# Patient Record
Sex: Male | Born: 1955 | Race: White | Hispanic: No | Marital: Married | Smoking: Former smoker
Health system: Southern US, Community
[De-identification: ages and names within clinical notes are randomized; demographics above are authoritative.]

## PROBLEM LIST (undated history)

## (undated) DIAGNOSIS — T8089XA Other complications following infusion, transfusion and therapeutic injection, initial encounter: Secondary | ICD-10-CM

## (undated) DIAGNOSIS — I10 Essential (primary) hypertension: Secondary | ICD-10-CM

## (undated) DIAGNOSIS — Z1211 Encounter for screening for malignant neoplasm of colon: Secondary | ICD-10-CM

## (undated) DIAGNOSIS — E119 Type 2 diabetes mellitus without complications: Secondary | ICD-10-CM

## (undated) DIAGNOSIS — M172 Bilateral post-traumatic osteoarthritis of knee: Secondary | ICD-10-CM

## (undated) DIAGNOSIS — Z0000 Encounter for general adult medical examination without abnormal findings: Secondary | ICD-10-CM

## (undated) DIAGNOSIS — Z125 Encounter for screening for malignant neoplasm of prostate: Secondary | ICD-10-CM

## (undated) DIAGNOSIS — M109 Gout, unspecified: Secondary | ICD-10-CM

## (undated) DIAGNOSIS — I2510 Atherosclerotic heart disease of native coronary artery without angina pectoris: Secondary | ICD-10-CM

## (undated) DIAGNOSIS — N200 Calculus of kidney: Secondary | ICD-10-CM

## (undated) DIAGNOSIS — E785 Hyperlipidemia, unspecified: Secondary | ICD-10-CM

## (undated) DIAGNOSIS — I5032 Chronic diastolic (congestive) heart failure: Secondary | ICD-10-CM

## (undated) DIAGNOSIS — K625 Hemorrhage of anus and rectum: Secondary | ICD-10-CM

## (undated) DIAGNOSIS — R0781 Pleurodynia: Secondary | ICD-10-CM

## (undated) HISTORY — DX: Type 2 diabetes mellitus without complications: E11.9

## (undated) HISTORY — DX: Calculus of kidney: N20.0

## (undated) HISTORY — DX: Hyperlipidemia, unspecified: E78.5

## (undated) HISTORY — DX: Pleurodynia: R07.81

## (undated) HISTORY — DX: Encounter for screening for malignant neoplasm of prostate: Z12.5

## (undated) HISTORY — DX: Chronic diastolic (congestive) heart failure: I50.32

## (undated) HISTORY — PX: CORONARY ANGIOPLASTY WITH STENT PLACEMENT: SHX49

## (undated) HISTORY — PX: TRIGGER FINGER RELEASE: SHX641

## (undated) HISTORY — DX: Atherosclerotic heart disease of native coronary artery without angina pectoris: I25.10

## (undated) HISTORY — PX: CARPAL TUNNEL RELEASE: SHX101

## (undated) HISTORY — DX: Other complications following infusion, transfusion and therapeutic injection, initial encounter: T80.89XA

## (undated) HISTORY — DX: Encounter for general adult medical examination without abnormal findings: Z00.00

## (undated) HISTORY — PX: KNEE SURGERY: SHX244

## (undated) HISTORY — DX: Encounter for screening for malignant neoplasm of colon: Z12.11

## (undated) HISTORY — DX: Hemorrhage of anus and rectum: K62.5

## (undated) HISTORY — DX: Essential (primary) hypertension: I10

## (undated) HISTORY — DX: Bilateral post-traumatic osteoarthritis of knee: M17.2

## (undated) HISTORY — DX: Gout, unspecified: M10.9

---

## 2014-02-09 ENCOUNTER — Other Ambulatory Visit: Payer: Self-pay | Admitting: Orthopedic Surgery

## 2014-02-09 DIAGNOSIS — M546 Pain in thoracic spine: Secondary | ICD-10-CM

## 2014-02-11 ENCOUNTER — Other Ambulatory Visit: Payer: Self-pay | Admitting: Orthopedic Surgery

## 2014-02-11 DIAGNOSIS — M546 Pain in thoracic spine: Secondary | ICD-10-CM

## 2014-02-25 ENCOUNTER — Ambulatory Visit
Admission: RE | Admit: 2014-02-25 | Discharge: 2014-02-25 | Disposition: A | Discharge status: Home / Self Care | Payer: 59 | Source: Ambulatory Visit | Attending: Orthopedic Surgery | Admitting: Orthopedic Surgery

## 2014-02-25 ENCOUNTER — Other Ambulatory Visit: Payer: Self-pay

## 2014-02-25 DIAGNOSIS — M546 Pain in thoracic spine: Secondary | ICD-10-CM

## 2014-07-07 DIAGNOSIS — I5032 Chronic diastolic (congestive) heart failure: Secondary | ICD-10-CM

## 2014-07-07 DIAGNOSIS — I2510 Atherosclerotic heart disease of native coronary artery without angina pectoris: Secondary | ICD-10-CM

## 2014-07-07 DIAGNOSIS — E785 Hyperlipidemia, unspecified: Secondary | ICD-10-CM

## 2014-07-07 DIAGNOSIS — I10 Essential (primary) hypertension: Secondary | ICD-10-CM

## 2014-07-07 HISTORY — DX: Atherosclerotic heart disease of native coronary artery without angina pectoris: I25.10

## 2014-07-07 HISTORY — DX: Hyperlipidemia, unspecified: E78.5

## 2014-07-07 HISTORY — DX: Chronic diastolic (congestive) heart failure: I50.32

## 2014-07-07 HISTORY — DX: Essential (primary) hypertension: I10

## 2015-02-10 DIAGNOSIS — N200 Calculus of kidney: Secondary | ICD-10-CM

## 2015-02-10 DIAGNOSIS — M109 Gout, unspecified: Secondary | ICD-10-CM

## 2015-02-10 DIAGNOSIS — I10 Essential (primary) hypertension: Secondary | ICD-10-CM

## 2015-02-10 DIAGNOSIS — E119 Type 2 diabetes mellitus without complications: Secondary | ICD-10-CM

## 2015-02-10 DIAGNOSIS — I2510 Atherosclerotic heart disease of native coronary artery without angina pectoris: Secondary | ICD-10-CM

## 2015-02-10 HISTORY — DX: Essential (primary) hypertension: I10

## 2015-02-10 HISTORY — DX: Atherosclerotic heart disease of native coronary artery without angina pectoris: I25.10

## 2015-02-10 HISTORY — DX: Calculus of kidney: N20.0

## 2015-02-10 HISTORY — DX: Type 2 diabetes mellitus without complications: E11.9

## 2015-02-10 HISTORY — DX: Gout, unspecified: M10.9

## 2015-02-28 DIAGNOSIS — K625 Hemorrhage of anus and rectum: Secondary | ICD-10-CM | POA: Insufficient documentation

## 2015-02-28 HISTORY — DX: Hemorrhage of anus and rectum: K62.5

## 2016-03-22 DIAGNOSIS — M172 Bilateral post-traumatic osteoarthritis of knee: Secondary | ICD-10-CM

## 2016-03-22 HISTORY — DX: Bilateral post-traumatic osteoarthritis of knee: M17.2

## 2016-05-30 DIAGNOSIS — Z0000 Encounter for general adult medical examination without abnormal findings: Secondary | ICD-10-CM

## 2016-05-30 DIAGNOSIS — Z1211 Encounter for screening for malignant neoplasm of colon: Secondary | ICD-10-CM | POA: Insufficient documentation

## 2016-05-30 DIAGNOSIS — Z125 Encounter for screening for malignant neoplasm of prostate: Secondary | ICD-10-CM

## 2016-05-30 HISTORY — DX: Encounter for screening for malignant neoplasm of prostate: Z12.5

## 2016-05-30 HISTORY — DX: Encounter for screening for malignant neoplasm of colon: Z12.11

## 2016-05-30 HISTORY — DX: Encounter for general adult medical examination without abnormal findings: Z00.00

## 2016-05-31 DIAGNOSIS — T8089XA Other complications following infusion, transfusion and therapeutic injection, initial encounter: Secondary | ICD-10-CM

## 2016-05-31 DIAGNOSIS — R0781 Pleurodynia: Secondary | ICD-10-CM

## 2016-05-31 DIAGNOSIS — R0789 Other chest pain: Secondary | ICD-10-CM | POA: Insufficient documentation

## 2016-05-31 HISTORY — DX: Pleurodynia: R07.81

## 2016-05-31 HISTORY — DX: Other complications following infusion, transfusion and therapeutic injection, initial encounter: T80.89XA

## 2016-08-28 ENCOUNTER — Other Ambulatory Visit: Payer: Self-pay

## 2016-09-14 NOTE — Progress Notes (Signed)
Cardiology Office Note:    Date:  09/18/2016   ID:  Kirk Frazier, DOB 1955-04-02, MRN [id number]  PCP:  Olive Bass, MD  Cardiologist:  Norman Herrlich, MD    Referring MD: Olive Bass, MD   Please do a BMP at his next visit   ASSESSMENT:    1. Chronic diastolic heart failure (HCC)   2. CAD in native artery   3. Benign hypertension   4. Hyperlipidemia, unspecified hyperlipidemia type    PLAN:    In order of problems listed above:  1. Stable continue his current loop diuretic sodium restriction and hopefully weight loss. 2. Stable continue current medical treatment with aspirin statin beta blocker 3. Stable blood pressure target continue current treatment 4. Stable continue his statin lipids are at goal    Next appointment: 6 months   Medication Adjustments/Labs and Tests Ordered: Current medicines are reviewed at length with the patient today.  Concerns regarding medicines are outlined above.  No orders of the defined types were placed in this encounter.  No orders of the defined types were placed in this encounter.   Chief Complaint  Patient presents with  . Follow-up    6 month flup appt   . Shortness of Breath  . Congestive Heart Failure  . Coronary Artery Disease  . Hypertension    History of Present Illness:    Kirk Frazier is a 61 y.o. male with a hx of hypertension, hyperlipidemia CAD, CHF, S/P PCI and DES to LCFx 01/08/14 for Troponin Normal unstable angina, hypertension and hyperlipidemia  last seen 6 months ago.unfortunately he is unable to sustain weight loss but he does comply with medications sodium restriction and has had no chest pain palpitation or syncope. He has exertional shortness of breath when he does more than usual activity. Compliance with diet, lifestyle and medications: yes Past Medical History:  Diagnosis Date  . Benign hypertension 02/10/2015  . CAD in native artery 07/07/2014   Overview:  PCI and DES to LCFx 01/08/14 for  Troponin  Normal unstable angina  . Chronic diastolic heart failure (HCC) 07/07/2014  . Coronary artery disease 02/10/2015   Overview:  Managed CARDS 2016: CIRC 95% PCI Resolute stent LAD 40% EF 65%  . Essential hypertension 07/07/2014  . Gout 02/10/2015   Overview:  2017: sUA=9.6  . 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 Hyperlipidemia 07/07/2014  . Nephrolithiasis 02/10/2015  . Post-traumatic osteoarthritis of both knees 03/22/2016  . Rectal bleeding 02/28/2015  . Rib pain on right side 05/31/2016   Overview:  2014: injury  . Screening for colon cancer 05/30/2016  . Screening for prostate cancer 05/30/2016  . Type 2 diabetes mellitus without complication, without long-term current use of insulin (HCC) 02/10/2015   Overview:  2016: 6.5 2017: 412 random with A1C 7.7, started MTF  . Wellness examination 05/30/2016    Past Surgical History:  Procedure Laterality Date  . CARPAL TUNNEL RELEASE    . CORONARY ANGIOPLASTY WITH STENT PLACEMENT    . KNEE SURGERY    . TRIGGER FINGER RELEASE      Current Medications: Current Meds  Medication Sig  . allopurinol (ZYLOPRIM) 100 MG tablet Take 200 mg by mouth daily.  Marland Kitchen aspirin EC 81 MG tablet Take 162 mg by mouth daily.  Marland Kitchen atorvastatin (LIPITOR) 80 MG tablet Take 80 mg by mouth daily.  . metFORMIN (GLUCOPHAGE-XR) 500 MG 24 hr tablet Take 1,000 mg by mouth 2 (two)  times daily.   . metoprolol tartrate (LOPRESSOR) 25 MG tablet Take 25 mg by mouth 2 (two) times daily.  . nitroGLYCERIN (NITROSTAT) 0.4 MG SL tablet PLACE 1 TABLET (0.4 MG TOTAL) UNDER THE TONGUE EVERY FIVE (5) MINUTES AS NEEDED FOR CHEST PAIN.  Marland Kitchen potassium chloride (KLOR-CON 10) 10 MEQ tablet Take 10 mEq by mouth daily.  . tadalafil (CIALIS) 10 MG tablet Take 10 mg by mouth daily as needed for erectile dysfunction.  . torsemide (DEMADEX) 20 MG tablet Take 20 mg by mouth 2 (two) times daily.  . valACYclovir (VALTREX) 500 MG tablet TAKE 4 TABLETS BY MOUTH EVERY 12 HOURS  FOR 1 DAY (START AFTER SYMPTOMS)     Allergies:   Morphine and Pseudoephedrine hcl   Social History   Social History  . Marital status: Significant Other    Spouse name: N/A  . Number of children: N/A  . Years of education: N/A   Social History Main Topics  . Smoking status: Former Games developer  . Smokeless tobacco: Never Used  . Alcohol use No  . Drug use: No  . Sexual activity: Not on file   Other Topics Concern  . Not on file   Social History Narrative  . No narrative on file     Family History: The patient's family history includes Cancer in his father; Diabetes in his brother and father; Heart attack in his brother; Heart failure in his father and mother; Hypertension in his mother; Thyroid disease in his sister. ROS:   Please see the history of present illness.    All other systems reviewed and are negative.  EKGs/Labs/Other Studies Reviewed:    The following studies were reviewed today:   Recent Labs: 09/06/16 A1c 7.1 No results found for requested labs within last 8760 hours.  Recent Lipid Panel 09/06/16: Chol 123, HDL 36, LDL 70 No results found for: CHOL, TRIG, HDL, CHOLHDL, VLDL, LDLCALC, LDLDIRECT  Physical Exam:    VS:  BP 116/78 (BP Location: Right Arm, Patient Position: Sitting)   Pulse 64   Ht  (1.753 m)   Wt (!) 302 lb 12.8 oz (137.3 kg)   SpO2 98%   BMI 44.72 kg/m     Wt Readings from Last 3 Encounters:  09/18/16 (!) 302 lb 12.8 oz (137.3 kg)     GEN:  Well nourished, well developed in no acute distress HEENT: Normal NECK: No JVD; No carotid bruits LYMPHATICS: No lymphadenopathy CARDIAC: RRR, no murmurs, rubs, gallops RESPIRATORY:  Clear to auscultation without rales, wheezing or rhonchi  ABDOMEN: Soft, non-tender, non-distended MUSCULOSKELETAL:  No edema; No deformity  SKIN: Warm and dry NEUROLOGIC:  Alert and oriented x 3 PSYCHIATRIC:  Normal affect    Signed, Norman Herrlich, MD  09/18/2016 4:33 PM    [facility]

## 2016-09-18 ENCOUNTER — Ambulatory Visit (INDEPENDENT_AMBULATORY_CARE_PROVIDER_SITE_OTHER): Payer: 59 | Admitting: Cardiology

## 2016-09-18 VITALS — BP 116/78 | HR 64 | Ht 69.0 in | Wt 302.8 lb

## 2016-09-18 DIAGNOSIS — I5032 Chronic diastolic (congestive) heart failure: Secondary | ICD-10-CM

## 2016-09-18 DIAGNOSIS — E785 Hyperlipidemia, unspecified: Secondary | ICD-10-CM | POA: Diagnosis not present

## 2016-09-18 DIAGNOSIS — I2510 Atherosclerotic heart disease of native coronary artery without angina pectoris: Secondary | ICD-10-CM | POA: Diagnosis not present

## 2016-09-18 DIAGNOSIS — I10 Essential (primary) hypertension: Secondary | ICD-10-CM | POA: Diagnosis not present

## 2016-09-18 NOTE — Patient Instructions (Signed)

## 2017-04-08 ENCOUNTER — Other Ambulatory Visit: Payer: Self-pay

## 2017-04-08 MED ORDER — TORSEMIDE 20 MG PO TABS: 20.0000 mg | ORAL_TABLET | Freq: Two times a day (BID) | ORAL | 3 refills | Status: DC

## 2017-04-30 ENCOUNTER — Telehealth: Payer: Self-pay | Admitting: Cardiology

## 2017-04-30 MED ORDER — POTASSIUM CHLORIDE ER 10 MEQ PO TBCR: 10.0000 meq | EXTENDED_RELEASE_TABLET | Freq: Every day | ORAL | 1 refills | Status: DC

## 2017-04-30 MED ORDER — ATORVASTATIN CALCIUM 80 MG PO TABS: 80.0000 mg | ORAL_TABLET | Freq: Every day | ORAL | 1 refills | Status: DC

## 2017-04-30 NOTE — Telephone Encounter (Signed)
Refill sent.

## 2017-04-30 NOTE — Telephone Encounter (Signed)
90 days scripts for atorvastatin 80 mg,potassium chloride to [facility] in [place] please

## 2017-07-12 ENCOUNTER — Encounter: Payer: Self-pay | Admitting: Cardiology

## 2017-07-12 ENCOUNTER — Ambulatory Visit: Payer: 59 | Admitting: Cardiology

## 2017-07-12 VITALS — BP 128/80 | HR 61 | Ht 69.0 in | Wt 305.0 lb

## 2017-07-12 DIAGNOSIS — I10 Essential (primary) hypertension: Secondary | ICD-10-CM | POA: Diagnosis not present

## 2017-07-12 DIAGNOSIS — I5032 Chronic diastolic (congestive) heart failure: Secondary | ICD-10-CM

## 2017-07-12 DIAGNOSIS — E785 Hyperlipidemia, unspecified: Secondary | ICD-10-CM | POA: Diagnosis not present

## 2017-07-12 DIAGNOSIS — I2510 Atherosclerotic heart disease of native coronary artery without angina pectoris: Secondary | ICD-10-CM | POA: Diagnosis not present

## 2017-07-12 MED ORDER — TORSEMIDE 20 MG PO TABS: 20.0000 mg | ORAL_TABLET | Freq: Two times a day (BID) | ORAL | 3 refills | Status: DC

## 2017-07-12 MED ORDER — POTASSIUM CHLORIDE ER 10 MEQ PO TBCR: 10.0000 meq | EXTENDED_RELEASE_TABLET | Freq: Every day | ORAL | 1 refills | Status: DC

## 2017-07-12 NOTE — Progress Notes (Signed)
Cardiology Office Note:    Date:  07/12/2017   ID:  Kirk Frazier, DOB [birth year], MRN [id number]  PCP:  Olive Bassough, Robert L, MD  Cardiologist:  Norman HerrlichBrian , MD    Referring MD: Olive Bassough, Robert L, MD    ASSESSMENT:    1. Coronary artery disease without angina pectoris, unspecified vessel or lesion type, unspecified whether native or transplanted heart   2. Essential hypertension   3. Chronic diastolic heart failure (HCC)   4. Hyperlipidemia, unspecified hyperlipidemia type    PLAN:    In order of problems listed above:  1. Stable continue current medical treatment aspirin beta-blocker high intensity statin at this time I do not think he requires an ischemia evaluation 2. Stable blood pressure target continue current treatment beta-blocker diuretic 3. Mildly decompensated asked him 1 day a week to take an extra tablet of diuretic to account for dietary indiscretion. 4. Stable lipids are ideal continue his statin   Next appointment: 6 months   Medication Adjustments/Labs and Tests Ordered: Current medicines are reviewed at length with the patient today.  Concerns regarding medicines are outlined above.  Orders Placed This Encounter  Procedures  . EKG 12-Lead   Meds ordered this encounter  Medications  . potassium chloride (KLOR-CON 10) 10 MEQ tablet    Sig: Take 1 tablet (10 mEq total) by mouth daily.    Dispense:  90 tablet    Refill:  1  . torsemide (DEMADEX) 20 MG tablet    Sig: Take 1 tablet (20 mg total) by mouth 2 (two) times daily.    Dispense:  180 tablet    Refill:  3    Chief Complaint  Patient presents with  . Follow-up    History of Present Illness:    Kirk Frazier is a 62 y.o. male with a hx of hypertension, hyperlipidemia CAD, CHF, S/P PCI and DES to LCFx 01/08/14 for Troponin Normal unstable angina, hypertension and hyperlipidemia  last seen 09/18/16. Compliance with diet, lifestyle and medications: Yes  Rubs 1-2 times a month he takes an extra  tablet of diuretic when he is dietary indiscretion weight is increased and finds himself with orthopnea.  Otherwise no dyspnea chest pain palpitation or syncope. Past Medical History:  Diagnosis Date  . Benign hypertension 02/10/2015  . CAD in native artery 07/07/2014   Overview:  PCI and DES to LCFx 01/08/14 for Troponin  Normal unstable angina  . Chronic diastolic heart failure (HCC) 07/07/2014  . Coronary artery disease 02/10/2015   Overview:  Managed CARDS 2016: CIRC 95% PCI Resolute stent LAD 40% EF 65%  . Essential hypertension 07/07/2014  . Gout 02/10/2015   Overview:  2017: sUA=9.6  . 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 Hyperlipidemia 07/07/2014  . Nephrolithiasis 02/10/2015  . Post-traumatic osteoarthritis of both knees 03/22/2016  . Rectal bleeding 02/28/2015  . Rib pain on right side 05/31/2016   Overview:  2014: injury  . Screening for colon cancer 05/30/2016  . Screening for prostate cancer 05/30/2016  . Type 2 diabetes mellitus without complication, without long-term current use of insulin (HCC) 02/10/2015   Overview:  2016: 6.5 2017: 412 random with A1C 7.7, started MTF  . Wellness examination 05/30/2016    Past Surgical History:  Procedure Laterality Date  . CARPAL TUNNEL RELEASE    . CORONARY ANGIOPLASTY WITH STENT PLACEMENT    . KNEE SURGERY    . TRIGGER FINGER RELEASE      Current  Medications: Current Meds  Medication Sig  . allopurinol (ZYLOPRIM) 100 MG tablet Take 100 mg by mouth daily.   Marland Kitchen aspirin EC 81 MG tablet Take 162 mg by mouth daily.  Marland Kitchen atorvastatin (LIPITOR) 80 MG tablet Take 1 tablet (80 mg total) by mouth daily.  . metFORMIN (GLUCOPHAGE-XR) 500 MG 24 hr tablet Take 1,000 mg by mouth 2 (two) times daily.   . metoprolol tartrate (LOPRESSOR) 25 MG tablet Take 25 mg by mouth 2 (two) times daily.  . nitroGLYCERIN (NITROSTAT) 0.4 MG SL tablet PLACE 1 TABLET (0.4 MG TOTAL) UNDER THE TONGUE EVERY FIVE (5) MINUTES AS NEEDED FOR CHEST PAIN.    Marland Kitchen potassium chloride (KLOR-CON 10) 10 MEQ tablet Take 1 tablet (10 mEq total) by mouth daily.  . tadalafil (CIALIS) 10 MG tablet Take 10 mg by mouth daily as needed for erectile dysfunction.  . torsemide (DEMADEX) 20 MG tablet Take 1 tablet (20 mg total) by mouth 2 (two) times daily.  . valACYclovir (VALTREX) 500 MG tablet TAKE 4 TABLETS BY MOUTH EVERY 12 HOURS FOR 1 DAY (START AFTER SYMPTOMS)  . [DISCONTINUED] potassium chloride (KLOR-CON 10) 10 MEQ tablet Take 1 tablet (10 mEq total) by mouth daily.  . [DISCONTINUED] torsemide (DEMADEX) 20 MG tablet Take 1 tablet (20 mg total) by mouth 2 (two) times daily.     Allergies:   Morphine and Pseudoephedrine hcl   Social History   Socioeconomic History  . Marital status: Significant Other    Spouse name: Not on file  . Number of children: Not on file  . Years of education: Not on file  . Highest education level: Not on file  Occupational History  . Not on file  Social Needs  . Financial resource strain: Not on file  . Food insecurity:    Worry: Not on file    Inability: Not on file  . Transportation needs:    Medical: Not on file    Non-medical: Not on file  Tobacco Use  . Smoking status: Former Games developer  . Smokeless tobacco: Never Used  Substance and Sexual Activity  . Alcohol use: No  . Drug use: No  . Sexual activity: Not on file  Lifestyle  . Physical activity:    Days per week: Not on file    Minutes per session: Not on file  . Stress: Not on file  Relationships  . Social connections:    Talks on phone: Not on file    Gets together: Not on file    Attends religious service: Not on file    Active member of club or organization: Not on file    Attends meetings of clubs or organizations: Not on file    Relationship status: Not on file  Other Topics Concern  . Not on file  Social History Narrative  . Not on file     Family History: The patient's family history includes Cancer in his father; Diabetes in his brother  and father; Heart attack in his brother; Heart failure in his father and mother; Hypertension in his mother; Thyroid disease in his sister. ROS:   Please see the history of present illness.    All other systems reviewed and are negative.  EKGs/Labs/Other Studies Reviewed:    The following studies were reviewed today:  EKG:  EKG ordered today.  The ekg ordered today demonstrates sinus rhythm normal EKG  Recent Labs: 03/13/2017 CMP is normal No results found for requested labs within last 8760 hours.  Recent  Lipid Panel cholesterol 121 HDL 38 LDL 68 No results found for: CHOL, TRIG, HDL, CHOLHDL, VLDL, LDLCALC, LDLDIRECT  Physical Exam:    VS:  BP 128/80 (BP Location: Left Arm, Patient Position: Sitting, Cuff Size: Large)   Pulse 61   Ht 5\' 9"  (1.753 m)   Wt (!) 305 lb (138.3 kg)   SpO2 98%   BMI 45.04 kg/m     Wt Readings from Last 3 Encounters:  07/12/17 (!) 305 lb (138.3 kg)  09/18/16 (!) 302 lb 12.8 oz (137.3 kg)     GEN:  Well nourished, well developed in no acute distress HEENT: Normal NECK: No JVD; No carotid bruits LYMPHATICS: No lymphadenopathy CARDIAC: RRR, no murmurs, rubs, gallops RESPIRATORY:  Clear to auscultation without rales, wheezing or rhonchi  ABDOMEN: Soft, non-tender, non-distended MUSCULOSKELETAL:  No edema; No deformity  SKIN: Warm and dry NEUROLOGIC:  Alert and oriented x 3 PSYCHIATRIC:  Normal affect    Signed, Norman Herrlich, MD  07/12/2017 5:03 PM    [facility]

## 2017-07-12 NOTE — Patient Instructions (Addendum)
Medication Instructions:  Your physician recommends that you continue on your current medications as directed. Please refer to the Current Medication list given to you today.   Labwork: NONE  Testing/Procedures: You had an EKG today  Follow-Up: Your physician wants you to follow-up in: 6 months.  You will receive a reminder letter in the mail two months in advance. If you don't receive a letter, please call our office to schedule the follow-up appointment.   Any Other Special Instructions Will Be Listed Below (If Applicable).     If you need a refill on your cardiac medications before your next appointment, please call your pharmacy.    Heart Failure  Weigh yourself every morning when you first wake up and record on a calender or note pad, bring this to your office visits. Using a pill tender can help with taking your medications consistently.  Limit your fluid intake to 2 liters daily  Limit your sodium intake to less than 2-3 grams daily. Ask if you need dietary teaching.  If you gain more than 3 pounds (from your dry weight ), double your dose of diuretic for the day.  If you gain more than 5 pounds (from your dry weight), double your dose of lasix and call your heart failure doctor.  Please do not smoke tobacco since it is very bad for your heart.  Please do not drink alcohol since it can worsen your heart failure.Also avoid OTC nonsteroidal drugs, such as advil, aleve and motrin.  Try to exercise for at least 30 minutes every day because this will help your heart be more efficient. You may be eligible for supervised cardiac rehab, ask your physician.

## 2017-07-31 ENCOUNTER — Other Ambulatory Visit: Payer: Self-pay

## 2017-07-31 MED ORDER — METOPROLOL TARTRATE 25 MG PO TABS: 25.0000 mg | ORAL_TABLET | Freq: Two times a day (BID) | ORAL | 2 refills | Status: DC

## 2017-08-19 ENCOUNTER — Other Ambulatory Visit: Payer: Self-pay

## 2017-08-19 ENCOUNTER — Emergency Department (HOSPITAL_BASED_OUTPATIENT_CLINIC_OR_DEPARTMENT_OTHER): Payer: 59

## 2017-08-19 ENCOUNTER — Telehealth: Payer: Self-pay

## 2017-08-19 ENCOUNTER — Emergency Department (HOSPITAL_BASED_OUTPATIENT_CLINIC_OR_DEPARTMENT_OTHER)
Admission: EM | Admit: 2017-08-19 | Discharge: 2017-08-19 | Disposition: A | Discharge status: Home / Self Care | Payer: 59 | Attending: Emergency Medicine | Admitting: Emergency Medicine

## 2017-08-19 ENCOUNTER — Encounter (HOSPITAL_BASED_OUTPATIENT_CLINIC_OR_DEPARTMENT_OTHER): Payer: Self-pay | Admitting: Emergency Medicine

## 2017-08-19 DIAGNOSIS — Z79899 Other long term (current) drug therapy: Secondary | ICD-10-CM | POA: Diagnosis not present

## 2017-08-19 DIAGNOSIS — Y9389 Activity, other specified: Secondary | ICD-10-CM | POA: Insufficient documentation

## 2017-08-19 DIAGNOSIS — E119 Type 2 diabetes mellitus without complications: Secondary | ICD-10-CM | POA: Diagnosis not present

## 2017-08-19 DIAGNOSIS — I2510 Atherosclerotic heart disease of native coronary artery without angina pectoris: Secondary | ICD-10-CM | POA: Diagnosis not present

## 2017-08-19 DIAGNOSIS — Y929 Unspecified place or not applicable: Secondary | ICD-10-CM | POA: Diagnosis not present

## 2017-08-19 DIAGNOSIS — Z87891 Personal history of nicotine dependence: Secondary | ICD-10-CM | POA: Insufficient documentation

## 2017-08-19 DIAGNOSIS — I5032 Chronic diastolic (congestive) heart failure: Secondary | ICD-10-CM | POA: Diagnosis not present

## 2017-08-19 DIAGNOSIS — I110 Hypertensive heart disease with heart failure: Secondary | ICD-10-CM | POA: Insufficient documentation

## 2017-08-19 DIAGNOSIS — Y999 Unspecified external cause status: Secondary | ICD-10-CM | POA: Insufficient documentation

## 2017-08-19 DIAGNOSIS — S299XXA Unspecified injury of thorax, initial encounter: Secondary | ICD-10-CM | POA: Diagnosis present

## 2017-08-19 DIAGNOSIS — X58XXXA Exposure to other specified factors, initial encounter: Secondary | ICD-10-CM | POA: Insufficient documentation

## 2017-08-19 DIAGNOSIS — S29011A Strain of muscle and tendon of front wall of thorax, initial encounter: Secondary | ICD-10-CM

## 2017-08-19 DIAGNOSIS — Z7982 Long term (current) use of aspirin: Secondary | ICD-10-CM | POA: Diagnosis not present

## 2017-08-19 LAB — BASIC METABOLIC PANEL
Anion gap: 11 (ref 5–15)
BUN: 25 mg/dL — ABNORMAL HIGH (ref 8–23)
CALCIUM: 8.9 mg/dL (ref 8.9–10.3)
CO2: 30 mmol/L (ref 22–32)
CREATININE: 1.23 mg/dL (ref 0.61–1.24)
Chloride: 99 mmol/L (ref 98–111)
GLUCOSE: 217 mg/dL — AB (ref 70–99)
Potassium: 4.2 mmol/L (ref 3.5–5.1)
Sodium: 140 mmol/L (ref 135–145)

## 2017-08-19 LAB — CBC
HCT: 42.9 % (ref 39.0–52.0)
Hemoglobin: 14.7 g/dL (ref 13.0–17.0)
MCH: 31.8 pg (ref 26.0–34.0)
MCHC: 34.3 g/dL (ref 30.0–36.0)
MCV: 92.9 fL (ref 78.0–100.0)
PLATELETS: 179 10*3/uL (ref 150–400)
RBC: 4.62 MIL/uL (ref 4.22–5.81)
RDW: 13.7 % (ref 11.5–15.5)
WBC: 6.2 10*3/uL (ref 4.0–10.5)

## 2017-08-19 LAB — TROPONIN I

## 2017-08-19 LAB — BRAIN NATRIURETIC PEPTIDE: B NATRIURETIC PEPTIDE 5: 18.6 pg/mL (ref 0.0–100.0)

## 2017-08-19 MED ORDER — ACETAMINOPHEN 500 MG PO TABS
1000.0000 mg | ORAL_TABLET | Freq: Four times a day (QID) | ORAL | Status: DC | PRN
  Administered 2017-08-19: 1000 mg via ORAL
  Filled 2017-08-19: qty 2

## 2017-08-19 MED ORDER — METHOCARBAMOL 500 MG PO TABS: 500.0000 mg | ORAL_TABLET | Freq: Two times a day (BID) | ORAL | 0 refills | Status: DC

## 2017-08-19 NOTE — Discharge Instructions (Addendum)
Thank you for allowing me to care for you today in the Emergency Department.   Take 1000 mg of Tylenol every 8 hours for pain control.  You can apply ice or heat for 15 to 20 minutes as frequently as needed to any areas that are sore.  Take 1 tablet of Robaxin up to 2 times daily.  This is a muscle relaxer and may help with your discomfort.  Start to stretch the muscles of the chest as your pain allows.  I have included some exercise for your convenience.  Your Robaxin prescription has been sent to your pharmacy as you requested.  Return to the emergency department if you develop new or worsening symptoms including chest pain with constant shortness of breath, nausea, sweating, jaw, neck, or left arm pain, or other new, concerning symptoms.

## 2017-08-19 NOTE — ED Provider Notes (Signed)
[facility] EMERGENCY DEPARTMENT Provider Note   CSN: [id number] Arrival date & time: 08/19/17  4098     History   Chief Complaint Chief Complaint  Patient presents with  . Chest Pain    HPI Kirk Frazier is a 62 y.o. male with a history of CAD s/p PCI and DES to LCFx 01/16, CHF, HTN, DM Type II, HLD, and gout who presents to the emergency department with a chief complaint of chest pain.  The patient endorses sudden onset central chest pain that began 4 days ago when he reached with his right arm across his body to the left to open the trunk of his car.  He states that when the episode happened at that he briefly broke out into a sweat and was seeing stars in both eyes.  No other episodes of diaphoresis or visual changes.  He characterizes the pain as tightness.  Pain has been constant since onset.  He reports that the pain was worse yesterday while he was out performing yard work and improved when he came back in and rested.  He also noted pain to be worse with laying flat.  He reports intermittent dyspnea with no known aggravating or alleviating factors.  He has a history of orthopnea and typically sleeps with one pillow or sleeps in his recliner if he gets short of breath.  He reports he has not required sleeping in his recliner since last weekend.  He reports that his weight fluctuates over 5 pounds, and is actually down 4 lbs from his baseline.    He denies jaw, neck, or left arm pain, nausea, vomiting, cough, abdominal pain, syncope, dizziness, lightheadedness, headache, or fever chills.  He has been compliant with his home medication. Last seen by Dr. Dulce Sellar, cardiology in 7/18.   The history is provided by the patient. No language interpreter was used.    Past Medical History:  Diagnosis Date  . Benign hypertension 02/10/2015  . CAD in native artery 07/07/2014   Overview:  PCI and DES to LCFx 01/08/14 for Troponin  Normal unstable angina  . Chronic diastolic heart  failure (HCC) 01/01/9145  . Coronary artery disease 02/10/2015   Overview:  Managed CARDS 2016: CIRC 95% PCI Resolute stent LAD 40% EF 65%  . Essential hypertension 07/07/2014  . Gout 02/10/2015   Overview:  2017: sUA=9.6  . 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 Hyperlipidemia 07/07/2014  . Nephrolithiasis 02/10/2015  . Post-traumatic osteoarthritis of both knees 03/22/2016  . Rectal bleeding 02/28/2015  . Rib pain on right side 05/31/2016   Overview:  2014: injury  . Screening for colon cancer 05/30/2016  . Screening for prostate cancer 05/30/2016  . Type 2 diabetes mellitus without complication, without long-term current use of insulin (HCC) 02/10/2015   Overview:  2016: 6.5 2017: 412 random with A1C 7.7, started MTF  . Wellness examination 05/30/2016    Patient Active Problem List   Diagnosis Date Noted  . 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 Rib pain on right side 05/31/2016  . Screening for colon cancer 05/30/2016  . Screening for prostate cancer 05/30/2016  . Wellness examination 05/30/2016  . Post-traumatic osteoarthritis of both knees 03/22/2016  . Rectal bleeding 02/28/2015  . Benign hypertension 02/10/2015  . Coronary artery disease 02/10/2015  . Gout 02/10/2015  . Nephrolithiasis 02/10/2015  . Type 2 diabetes mellitus without complication, without long-term current use of insulin (HCC) 02/10/2015  . CAD in  native artery 07/07/2014  . Chronic diastolic heart failure (HCC) 07/07/2014  . Essential hypertension 07/07/2014  . Hyperlipidemia 07/07/2014    Past Surgical History:  Procedure Laterality Date  . CARPAL TUNNEL RELEASE    . CORONARY ANGIOPLASTY WITH STENT PLACEMENT    . KNEE SURGERY    . TRIGGER FINGER RELEASE          Home Medications    Prior to Admission medications   Medication Sig Start Date End Date Taking? Authorizing Provider  allopurinol (ZYLOPRIM) 100 MG tablet Take 100 mg by mouth daily.  08/30/15    [provider]  aspirin EC 81 MG tablet Take 162 mg by mouth daily.    [provider]  atorvastatin (LIPITOR) 80 MG tablet Take 1 tablet (80 mg total) by mouth daily. 04/30/17   Baldo Daub, MD  metFORMIN (GLUCOPHAGE-XR) 500 MG 24 hr tablet Take 1,000 mg by mouth 2 (two) times daily.  04/20/16   [provider]  methocarbamol (ROBAXIN) 500 MG tablet Take 1 tablet (500 mg total) by mouth 2 (two) times daily. 08/19/17   Jessicamarie Amiri A, PA-C  metoprolol tartrate (LOPRESSOR) 25 MG tablet Take 1 tablet (25 mg total) by mouth 2 (two) times daily. 07/31/17   Baldo Daub, MD  nitroGLYCERIN (NITROSTAT) 0.4 MG SL tablet PLACE 1 TABLET (0.4 MG TOTAL) UNDER THE TONGUE EVERY FIVE (5) MINUTES AS NEEDED FOR CHEST PAIN. 05/23/15   [provider]  potassium chloride (KLOR-CON 10) 10 MEQ tablet Take 1 tablet (10 mEq total) by mouth daily. 07/12/17 07/12/18  Baldo Daub, MD  tadalafil (CIALIS) 10 MG tablet Take 10 mg by mouth daily as needed for erectile dysfunction. 06/26/16   [provider]  torsemide (DEMADEX) 20 MG tablet Take 1 tablet (20 mg total) by mouth 2 (two) times daily. 07/12/17   Baldo Daub, MD  valACYclovir (VALTREX) 500 MG tablet TAKE 4 TABLETS BY MOUTH EVERY 12 HOURS FOR 1 DAY (START AFTER SYMPTOMS) 01/19/15   [provider]    Family History Family History  Problem Relation Age of Onset  . Hypertension Mother   . Heart failure Mother   . Cancer Father   . Heart failure Father   . Diabetes Father   . Heart attack Brother   . Diabetes Brother   . Thyroid disease Sister     Social History Social History   Tobacco Use  . Smoking status: Former Games developer  . Smokeless tobacco: Never Used  Substance Use Topics  . Alcohol use: No  . Drug use: No     Allergies   Morphine and Pseudoephedrine hcl   Review of Systems Review of Systems  Constitutional: Negative for appetite change, chills, diaphoresis and fever.    Respiratory: Positive for shortness of breath. Negative for wheezing.   Cardiovascular: Positive for chest pain. Negative for palpitations and leg swelling.  Gastrointestinal: Negative for abdominal pain, diarrhea, nausea and vomiting.  Genitourinary: Negative for dysuria.  Musculoskeletal: Negative for back pain.  Skin: Negative for rash.  Allergic/Immunologic: Negative for immunocompromised state.  Neurological: Negative for weakness, numbness and headaches.  Psychiatric/Behavioral: Negative for confusion.     Physical Exam Updated Vital Signs BP 117/74   Pulse (!) 55   Temp 98.4 F (36.9 C)   Resp 18   Ht 5\' 8"  (1.727 m)   Wt 135.6 kg   SpO2 99%   BMI 45.46 kg/m   Physical Exam  Constitutional: He appears well-developed.  HENT:  Head: Normocephalic.  Eyes: Conjunctivae are normal.  Neck: Neck supple. No JVD present.  Cardiovascular: Normal rate, regular rhythm, normal heart sounds and intact distal pulses. Exam reveals no gallop and no friction rub.  No murmur heard. Pulmonary/Chest: Effort normal. No stridor. No respiratory distress. He has no wheezes. He has no rales. He exhibits tenderness.  Reproducible chest pain with pulling motion of the bilateral upper extremities.  No reproducible tenderness to palpation of the anterior chest wall.  Abdominal: Soft. He exhibits no distension and no mass. There is no tenderness. There is no rebound and no guarding. No hernia.  Obese abdomen  Musculoskeletal: He exhibits no tenderness or deformity.  Lymphadenopathy:    He has no cervical adenopathy.  Neurological: He is alert.  Skin: Skin is warm and dry.  Psychiatric: His behavior is normal.  Nursing note and vitals reviewed.    ED Treatments / Results  Labs (all labs ordered are listed, but only abnormal results are displayed) Labs Reviewed  BASIC METABOLIC PANEL - Abnormal; Notable for the following components:      Result Value   Glucose, Bld 217 (*)    BUN 25 (*)     All other components within normal limits  CBC  TROPONIN I  BRAIN NATRIURETIC PEPTIDE    EKG EKG Interpretation  Date/Time:  Monday August 19 2017 09:38:27 EDT Ventricular Rate:  57 PR Interval:    QRS Duration: 106 QT Interval:  430 QTC Calculation: 419 R Axis:   55 Text Interpretation:  Sinus rhythm Low voltage, precordial leads No old tracing to compare Confirmed by Rolan BuccoBelfi, Melanie [phone]([id number]) on 08/19/2017 10:06:13 AM   Radiology Dg Chest 2 View  Result Date: 08/19/2017 CLINICAL DATA:  Chest pain EXAM: CHEST - 2 VIEW COMPARISON:  02/01/2015.  01/06/2014. FINDINGS: The lungs are clear without focal pneumonia, edema, pneumothorax or pleural effusion. Small nodule left lower lung is stable back to 2016, compatible with granuloma. The cardiopericardial silhouette is within normal limits for size. The visualized bony structures of the thorax are intact. Telemetry leads overlie the chest. IMPRESSION: Stable.  No acute findings. Electronically Signed   By: Kennith CenterEric  Mansell M.D.   On: 08/19/2017 09:56    Procedures Procedures (including critical care time)  Medications Ordered in ED Medications - No data to display   Initial Impression / Assessment and Plan / ED Course  I have reviewed the triage vital signs and the nursing notes.  Pertinent labs & imaging results that were available during my care of the patient were reviewed by me and considered in my medical decision making (see chart for details).     62 year old male with a history of CAD s/p PCI and DES to LCFx 01/16, CHF, HTN, DM Type II, HLD, and gout ending with chest pain and intermittent dyspnea for the last 4 days.  EKG with no acute changes.  Chest x-ray is unremarkable.  Labs with hyperglycemia of 217, but otherwise unremarkable.  Troponin is negative.  The patient was discussed and evaluated by Dr. Fredderick PhenixBelfi, attending physician.  Discussed the patient with Dr. Dulce SellarMunley, cardiology, as he shunts previous cardiac catheterization  and echo reports are not available in epic and the patient has a significant cardiac history.  Dr. Dulce SellarMunley felt that the patient's chest pain was atypical and most likely musculoskeletal in nature.  Doubt ACS, pneumonia, PE, or CHF exacerbation.  Discussed these findings with the patient has wife.  We will discharge the patient home with Tylenol  for pain control and a short course of Robaxin.  Strict return precautions given.  The patient is hemodynamically stable and in no acute distress.  He is safe for discharge home with outpatient follow-up at this time.  Final Clinical Impressions(s) / ED Diagnoses   Final diagnoses:  Muscle strain of chest wall, initial encounter    ED Discharge Orders         Ordered    methocarbamol (ROBAXIN) 500 MG tablet  2 times daily     08/19/17 1251           Avonelle Viveros A, PA-C 08/19/17 2203    Rolan BuccoBelfi, Melanie, MD 08/20/17 [phone]

## 2017-08-19 NOTE — ED Notes (Signed)
ED Provider at bedside. 

## 2017-08-19 NOTE — Telephone Encounter (Signed)
Patient called stating that he has been having active chest pain all weekend and has been having difficulty breathing. Patient states that he has been taking his fluid medication as prescribed and has not noticed excessive weight gain. Per the patient he bent over and "almost passed out." Informed patient that he should take 325 mg ASAS, nitro (for continued chest pain), and his beta blocker. Also informed the patient that he should go to the ED as we do not have appointments today and patient would require cardiac testing today. Patient was agreeable to go to the ED in [facility].

## 2017-08-19 NOTE — ED Triage Notes (Signed)
Chest pain since Friday afternoon.  States this is constant and occurred while trying to reach down and open the trunk.  Reports pain is center to left side and c/o shortness of breath while laying flat.  Denies nausea, vomiting.

## 2017-09-30 ENCOUNTER — Telehealth: Payer: Self-pay

## 2017-09-30 ENCOUNTER — Other Ambulatory Visit: Payer: Self-pay

## 2017-09-30 MED ORDER — ATORVASTATIN CALCIUM 80 MG PO TABS: 80.0000 mg | ORAL_TABLET | Freq: Every day | ORAL | 2 refills | Status: DC

## 2017-09-30 NOTE — Telephone Encounter (Signed)
Rx sent to pharmacy as requested.

## 2018-01-28 ENCOUNTER — Other Ambulatory Visit: Payer: Self-pay | Admitting: Cardiology

## 2018-03-03 NOTE — Progress Notes (Signed)
Cardiology Office Note:    Date:  03/04/2018   ID:  Kirk Frazier, DOB 1955/07/23, MRN [id number]  PCP:  Olive Bass, MD  Cardiologist:  Norman Herrlich, MD    Referring MD: Olive Bass, MD    ASSESSMENT:    No diagnosis found. PLAN:    In order of problems listed above:  1. Stable CAD New York Heart Association class I at this time and I think he requires an ischemia evaluation continues medical therapy including aspirin high intensity statin and beta-blocker.  He has had no angina on current treatment 2. Heart failure stable compensated continue current diuretic sodium restriction self-management Daily weights 3. Hypertension stable blood pressure target continue current treatment 4. Hyperlipidemia stable lipids at target continue high intensity statin   Next appointment: 1 year   Medication Adjustments/Labs and Tests Ordered: Current medicines are reviewed at length with the patient today.  Concerns regarding medicines are outlined above.  No orders of the defined types were placed in this encounter.  No orders of the defined types were placed in this encounter.   Chief Complaint  Patient presents with  . Follow-up  . Coronary Artery Disease  . Congestive Heart Failure  . Hypertension  . Hyperlipidemia    History of Present Illness:    Kirk Frazier is a 63 y.o. male with a hx of  hypertension, hyperlipidemia CAD, CHF, S/P PCI and DES to LCFx 01/08/14 for Troponin Normal unstable angina, hypertension and hyperlipidemia  last seen 07/12/17. Compliance with diet, lifestyle and medications: yes  He is pleased with the quality of his life he has not had edema shortness of breath chest pain palpitation or syncope. Past Medical History:  Diagnosis Date  . Benign hypertension 02/10/2015  . CAD in native artery 07/07/2014   Overview:  PCI and DES to LCFx 01/08/14 for Troponin  Normal unstable angina  . Chronic diastolic heart failure (HCC) 07/07/2014  . Coronary  artery disease 02/10/2015   Overview:  Managed CARDS 2016: CIRC 95% PCI Resolute stent LAD 40% EF 65%  . Essential hypertension 07/07/2014  . Gout 02/10/2015   Overview:  2017: sUA=9.6  . 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 Hyperlipidemia 07/07/2014  . Nephrolithiasis 02/10/2015  . Post-traumatic osteoarthritis of both knees 03/22/2016  . Rectal bleeding 02/28/2015  . Rib pain on right side 05/31/2016   Overview:  2014: injury  . Screening for colon cancer 05/30/2016  . Screening for prostate cancer 05/30/2016  . Type 2 diabetes mellitus without complication, without long-term current use of insulin (HCC) 02/10/2015   Overview:  2016: 6.5 2017: 412 random with A1C 7.7, started MTF  . Wellness examination 05/30/2016    Past Surgical History:  Procedure Laterality Date  . CARPAL TUNNEL RELEASE    . CORONARY ANGIOPLASTY WITH STENT PLACEMENT    . KNEE SURGERY    . TRIGGER FINGER RELEASE      Current Medications: Current Meds  Medication Sig  . allopurinol (ZYLOPRIM) 100 MG tablet Take 100 mg by mouth daily.   Marland Kitchen aspirin EC 81 MG tablet Take 162 mg by mouth daily.  Marland Kitchen atorvastatin (LIPITOR) 80 MG tablet Take 1 tablet (80 mg total) by mouth daily.  Marland Kitchen doxycycline (ORACEA) 40 MG capsule Take 40 mg by mouth daily.  . fluorouracil (EFUDEX) 5 % cream Apply 1 application topically daily.  . Ivermectin (SOOLANTRA) 1 % CREA Apply 1 application topically daily.  . metFORMIN (GLUCOPHAGE-XR) 500  MG 24 hr tablet Take 1,000 mg by mouth 2 (two) times daily.   . metoprolol tartrate (LOPRESSOR) 25 MG tablet Take 1 tablet (25 mg total) by mouth 2 (two) times daily.  . nitroGLYCERIN (NITROSTAT) 0.4 MG SL tablet PLACE 1 TABLET (0.4 MG TOTAL) UNDER THE TONGUE EVERY FIVE (5) MINUTES AS NEEDED FOR CHEST PAIN.  Marland Kitchen potassium chloride (K-DUR) 10 MEQ tablet TAKE 1 TABLET BY MOUTH EVERY DAY  . tadalafil (CIALIS) 10 MG tablet Take 10 mg by mouth daily as needed for erectile dysfunction.  .  torsemide (DEMADEX) 20 MG tablet Take 1 tablet (20 mg total) by mouth 2 (two) times daily.  . valACYclovir (VALTREX) 500 MG tablet TAKE 4 TABLETS BY MOUTH EVERY 12 HOURS FOR 1 DAY (START AFTER SYMPTOMS)     Allergies:   Morphine and Pseudoephedrine hcl   Social History   Socioeconomic History  . Marital status: Significant Other    Spouse name: Not on file  . Number of children: Not on file  . Years of education: Not on file  . Highest education level: Not on file  Occupational History  . Not on file  Social Needs  . Financial resource strain: Not on file  . Food insecurity:    Worry: Not on file    Inability: Not on file  . Transportation needs:    Medical: Not on file    Non-medical: Not on file  Tobacco Use  . Smoking status: Former Games developer  . Smokeless tobacco: Never Used  Substance and Sexual Activity  . Alcohol use: No  . Drug use: No  . Sexual activity: Not on file  Lifestyle  . Physical activity:    Days per week: Not on file    Minutes per session: Not on file  . Stress: Not on file  Relationships  . Social connections:    Talks on phone: Not on file    Gets together: Not on file    Attends religious service: Not on file    Active member of club or organization: Not on file    Attends meetings of clubs or organizations: Not on file    Relationship status: Not on file  Other Topics Concern  . Not on file  Social History Narrative  . Not on file     Family History: The patient's family history includes Cancer in his father; Diabetes in his brother and father; Heart attack in his brother; Heart failure in his father and mother; Hypertension in his mother; Thyroid disease in his sister. ROS:   Please see the history of present illness.    All other systems reviewed and are negative.  EKGs/Labs/Other Studies Reviewed:    The following studies were reviewed today:  EKG:  EKG ordered today and personally reviewed.  The ekg ordered today demonstrates sinus  bradycardia 56 BPM normal EKG  Recent Labs: 09/10/2017 cholesterol 137 LDL 83 HDL 42 CMP normal except for glucose 184 08/19/2017: B Natriuretic Peptide 18.6; BUN 25; Creatinine, Ser 1.23; Hemoglobin 14.7; Platelets 179; Potassium 4.2; Sodium 140  Recent Lipid Panel No results found for: CHOL, TRIG, HDL, CHOLHDL, VLDL, LDLCALC, LDLDIRECT  Physical Exam:    VS:  BP 112/78 (BP Location: Right Arm, Patient Position: Sitting, Cuff Size: Large)   Pulse (!) 58   Ht  (1.753 m)   Wt 296 lb 3.2 oz (134.4 kg)   SpO2 98%   BMI 43.74 kg/m     Wt Readings from Last 3  Encounters:  03/04/18 296 lb 3.2 oz (134.4 kg)  08/19/17 299 lb (135.6 kg)  07/12/17 (!) 305 lb (138.3 kg)     GEN:  Well nourished, well developed in no acute distress HEENT: Normal NECK: No JVD; No carotid bruits LYMPHATICS: No lymphadenopathy CARDIAC: RRR, no murmurs, rubs, gallops RESPIRATORY:  Clear to auscultation without rales, wheezing or rhonchi  ABDOMEN: Soft, non-tender, non-distended MUSCULOSKELETAL:  No edema; No deformity  SKIN: Warm and dry NEUROLOGIC:  Alert and oriented x 3 PSYCHIATRIC:  Normal affect    Signed, Norman Herrlich, MD  03/04/2018 3:54 PM    [facility]

## 2018-03-04 ENCOUNTER — Encounter: Payer: Self-pay | Admitting: Cardiology

## 2018-03-04 ENCOUNTER — Ambulatory Visit (INDEPENDENT_AMBULATORY_CARE_PROVIDER_SITE_OTHER): Payer: BLUE CROSS/BLUE SHIELD | Admitting: Cardiology

## 2018-03-04 VITALS — BP 112/78 | HR 58 | Ht 69.0 in | Wt 296.2 lb

## 2018-03-04 DIAGNOSIS — I25118 Atherosclerotic heart disease of native coronary artery with other forms of angina pectoris: Secondary | ICD-10-CM | POA: Diagnosis not present

## 2018-03-04 DIAGNOSIS — I5032 Chronic diastolic (congestive) heart failure: Secondary | ICD-10-CM

## 2018-03-04 DIAGNOSIS — I10 Essential (primary) hypertension: Secondary | ICD-10-CM | POA: Diagnosis not present

## 2018-03-04 NOTE — Patient Instructions (Signed)

## 2018-06-03 ENCOUNTER — Other Ambulatory Visit: Payer: Self-pay | Admitting: Cardiology

## 2018-09-16 ENCOUNTER — Other Ambulatory Visit: Payer: Self-pay

## 2018-09-16 MED ORDER — TORSEMIDE 20 MG PO TABS: 20.0000 mg | ORAL_TABLET | Freq: Two times a day (BID) | ORAL | 1 refills | Status: DC

## 2018-09-17 ENCOUNTER — Telehealth: Payer: Self-pay | Admitting: Cardiology

## 2018-09-17 MED ORDER — POTASSIUM CHLORIDE ER 10 MEQ PO TBCR: 10.0000 meq | EXTENDED_RELEASE_TABLET | Freq: Every day | ORAL | 1 refills | Status: DC

## 2018-09-17 NOTE — Telephone Encounter (Signed)
Call [facility] #[phone] to [facility] [place]

## 2018-09-17 NOTE — Telephone Encounter (Signed)
Rx for potassium sent to [facility] in [place] as requested.

## 2018-11-17 ENCOUNTER — Other Ambulatory Visit: Payer: Self-pay | Admitting: Cardiology

## 2018-11-25 ENCOUNTER — Other Ambulatory Visit: Payer: Self-pay

## 2018-11-25 MED ORDER — ATORVASTATIN CALCIUM 80 MG PO TABS: 80.0000 mg | ORAL_TABLET | Freq: Every day | ORAL | 1 refills | Status: DC

## 2019-02-05 DIAGNOSIS — R109 Unspecified abdominal pain: Secondary | ICD-10-CM | POA: Insufficient documentation

## 2019-02-05 HISTORY — DX: Unspecified abdominal pain: R10.9

## 2019-02-14 ENCOUNTER — Other Ambulatory Visit: Payer: Self-pay | Admitting: Cardiology

## 2019-03-28 ENCOUNTER — Other Ambulatory Visit: Payer: Self-pay | Admitting: Cardiology

## 2019-04-02 ENCOUNTER — Other Ambulatory Visit: Payer: Self-pay

## 2019-04-02 ENCOUNTER — Encounter: Payer: Self-pay | Admitting: Cardiology

## 2019-04-02 ENCOUNTER — Ambulatory Visit (INDEPENDENT_AMBULATORY_CARE_PROVIDER_SITE_OTHER): Payer: BC Managed Care – PPO | Admitting: Cardiology

## 2019-04-02 VITALS — BP 122/68 | HR 57 | Ht 69.0 in | Wt 284.6 lb

## 2019-04-02 DIAGNOSIS — I25118 Atherosclerotic heart disease of native coronary artery with other forms of angina pectoris: Secondary | ICD-10-CM

## 2019-04-02 DIAGNOSIS — I5032 Chronic diastolic (congestive) heart failure: Secondary | ICD-10-CM | POA: Diagnosis not present

## 2019-04-02 DIAGNOSIS — E785 Hyperlipidemia, unspecified: Secondary | ICD-10-CM

## 2019-04-02 DIAGNOSIS — I10 Essential (primary) hypertension: Secondary | ICD-10-CM

## 2019-04-02 NOTE — Progress Notes (Signed)
Cardiology Office Note:    Date:  04/02/2019   ID:  Kirk Frazier, DOB 12/24/1955, MRN [id number]  PCP:  Olive Bass, MD  Cardiologist:  Norman Herrlich, MD    Referring MD: Olive Bass, MD    ASSESSMENT:    1. Coronary artery disease with stable angina pectoris, unspecified vessel or lesion type, unspecified whether native or transplanted heart (HCC)   2. Chronic diastolic heart failure (HCC)   3. Essential hypertension   4. Hyperlipidemia, unspecified hyperlipidemia type    PLAN:    In order of problems listed above:  1. Stable CAD having no anginal discomfort continue medical therapy including aspirin combined lipid-lowering with Zetia and atorvastatin and beta-blocker.  This time I think he requires an ischemia evaluation 2. Heart failure is compensated New York Heart Association class I no edema continue his current diuretic 3. BP at target continue current medications including beta-blocker diuretic 4. Lipids are ideal continue his combined lipid-lowering therapy   Next appointment: 1 year   Medication Adjustments/Labs and Tests Ordered: Current medicines are reviewed at length with the patient today.  Concerns regarding medicines are outlined above.  Orders Placed This Encounter  Procedures  . EKG 12-Lead   No orders of the defined types were placed in this encounter.   No chief complaint on file.   History of Present Illness:    Kirk Frazier is a 64 y.o. male with a hx of hypertension, hyperlipidemia CAD, CHF, S/P PCI and DES to LCFx 01/08/14 for Troponin Normal unstable angina, hypertension and hyperlipidemia  last seen 03/04/2018. Compliance with diet, lifestyle and medications: Yes  He is doing better he thinks he has lost some weight he has no edema he has mild exertional shortness of breath with more than usual activities not severe or limiting has not had angina or use nitroglycerin.  He has had no edema orthopnea palpitation or syncope.  He  tolerates his statin lipids are ideal without muscle pain or weakness in his diabetic medications.  Labs with his PCP 09/26/2018: Cholesterol 117 triglycerides 112 HDL 37 LDL 66 CMP normal except for glucose 82 with an A1c of 8.0 Past Medical History:  Diagnosis Date  . Benign hypertension 02/10/2015  . CAD in native artery 07/07/2014   Overview:  PCI and DES to LCFx 01/08/14 for Troponin  Normal unstable angina  . Chronic diastolic heart failure (HCC) 07/07/2014  . Coronary artery disease 02/10/2015   Overview:  Managed CARDS 2016: CIRC 95% PCI Resolute stent LAD 40% EF 65%  . Essential hypertension 07/07/2014  . Gout 02/10/2015   Overview:  2017: sUA=9.6  . 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 Hyperlipidemia 07/07/2014  . Nephrolithiasis 02/10/2015  . Post-traumatic osteoarthritis of both knees 03/22/2016  . Rectal bleeding 02/28/2015  . Rib pain on right side 05/31/2016   Overview:  2014: injury  . Screening for colon cancer 05/30/2016  . Screening for prostate cancer 05/30/2016  . Type 2 diabetes mellitus without complication, without long-term current use of insulin (HCC) 02/10/2015   Overview:  2016: 6.5 2017: 412 random with A1C 7.7, started MTF  . Wellness examination 05/30/2016    Past Surgical History:  Procedure Laterality Date  . CARPAL TUNNEL RELEASE    . CORONARY ANGIOPLASTY WITH STENT PLACEMENT    . KNEE SURGERY    . TRIGGER FINGER RELEASE      Current Medications: No outpatient medications have been marked as taking for  the 04/02/19 encounter (Office Visit) with Richardo Priest, MD.     Allergies:   Morphine and Pseudoephedrine hcl   Social History   Socioeconomic History  . Marital status: Significant Other    Spouse name: Not on file  . Number of children: Not on file  . Years of education: Not on file  . Highest education level: Not on file  Occupational History  . Not on file  Tobacco Use  . Smoking status: Former Research scientist (life sciences)  . Smokeless  tobacco: Never Used  Substance and Sexual Activity  . Alcohol use: No  . Drug use: No  . Sexual activity: Not on file  Other Topics Concern  . Not on file  Social History Narrative  . Not on file   Social Determinants of Health   Financial Resource Strain:   . Difficulty of Paying Living Expenses:   Food Insecurity:   . Worried About Charity fundraiser in the Last Year:   . Arboriculturist in the Last Year:   Transportation Needs:   . Film/video editor (Medical):   Marland Kitchen Lack of Transportation (Non-Medical):   Physical Activity:   . Days of Exercise per Week:   . Minutes of Exercise per Session:   Stress:   . Feeling of Stress :   Social Connections:   . Frequency of Communication with Friends and Family:   . Frequency of Social Gatherings with Friends and Family:   . Attends Religious Services:   . Active Member of Clubs or Organizations:   . Attends Archivist Meetings:   Marland Kitchen Marital Status:      Family History: The patient's family history includes Cancer in his father; Diabetes in his brother and father; Heart attack in his brother; Heart failure in his father and mother; Hypertension in his mother; Thyroid disease in his sister. ROS:   Please see the history of present illness.    All other systems reviewed and are negative.  EKGs/Labs/Other Studies Reviewed:    The following studies were reviewed today:  EKG:  EKG ordered today and personally reviewed.  The ekg ordered today demonstrates sinus bradycardia 54 bpm first-degree AV block otherwise normal    Physical Exam:    VS:  BP 122/68   Pulse (!) 57   Ht 5\' 9"  (1.753 m)   Wt 284 lb 9.6 oz (129.1 kg)   SpO2 98%   BMI 42.03 kg/m     Wt Readings from Last 3 Encounters:  04/02/19 284 lb 9.6 oz (129.1 kg)  03/04/18 296 lb 3.2 oz (134.4 kg)  08/19/17 299 lb (135.6 kg)     GEN:  Well nourished, well developed in no acute distress HEENT: Normal NECK: No JVD; No carotid bruits LYMPHATICS: No  lymphadenopathy CARDIAC: Lawernce Pitts RRR, no murmurs, rubs, gallops RESPIRATORY:  Clear to auscultation without rales, wheezing or rhonchi  ABDOMEN: Soft, non-tender, non-distended MUSCULOSKELETAL:  No edema; No deformity  SKIN: Warm and dry NEUROLOGIC:  Alert and oriented x 3 PSYCHIATRIC:  Normal affect    Signed, Shirlee More, MD  04/02/2019 3:00 PM    [facility]

## 2019-04-02 NOTE — Patient Instructions (Signed)

## 2019-04-21 ENCOUNTER — Other Ambulatory Visit: Payer: Self-pay | Admitting: Cardiology

## 2019-05-06 ENCOUNTER — Other Ambulatory Visit: Payer: Self-pay | Admitting: Family

## 2019-05-06 ENCOUNTER — Other Ambulatory Visit: Payer: Self-pay | Admitting: Cardiology

## 2019-05-17 ENCOUNTER — Other Ambulatory Visit: Payer: Self-pay | Admitting: Cardiology

## 2019-05-22 DIAGNOSIS — R4182 Altered mental status, unspecified: Secondary | ICD-10-CM | POA: Insufficient documentation

## 2019-05-22 HISTORY — DX: Altered mental status, unspecified: R41.82

## 2019-05-28 DIAGNOSIS — Z8673 Personal history of transient ischemic attack (TIA), and cerebral infarction without residual deficits: Secondary | ICD-10-CM | POA: Insufficient documentation

## 2019-05-28 HISTORY — DX: Personal history of transient ischemic attack (TIA), and cerebral infarction without residual deficits: Z86.73

## 2019-06-03 ENCOUNTER — Telehealth: Payer: Self-pay | Admitting: Cardiology

## 2019-06-03 NOTE — Telephone Encounter (Signed)
Kirk Frazier is calling to inform Kirk Frazier that Kirk Frazier performed an MRI and the results came back that he has had 3 strokes. He is going to [facility] today to be check for blood clots in his neck at 10:30 AM.

## 2019-06-04 ENCOUNTER — Telehealth: Payer: Self-pay | Admitting: Cardiology

## 2019-06-04 DIAGNOSIS — I6523 Occlusion and stenosis of bilateral carotid arteries: Secondary | ICD-10-CM

## 2019-06-04 HISTORY — DX: Occlusion and stenosis of bilateral carotid arteries: I65.23

## 2019-06-04 NOTE — Telephone Encounter (Signed)
Spoke with Candise Bowens at Dr. Norlene Duel office and let her know that I did not see a echo on file for Kirk Frazier. I also let her know that I looked in [facility] system and they also did not have any echo's on file. She verbalizes understanding and thanks me for the call.

## 2019-06-04 NOTE — Telephone Encounter (Signed)
Jen from Dr. Robyne Peers office calling to find out when the patient's last echo was done.

## 2019-06-10 ENCOUNTER — Telehealth: Payer: Self-pay | Admitting: Cardiology

## 2019-06-10 DIAGNOSIS — I5032 Chronic diastolic (congestive) heart failure: Secondary | ICD-10-CM

## 2019-06-10 NOTE — Telephone Encounter (Signed)
Echo was reviewed by Dr. Dulce Sellar but Dr. Dulce Sellar states that he would not like for me to call the patient with the results because he will let Dr. Sol Passer handle it since he is the one that ordered the echo for the patient.

## 2019-06-10 NOTE — Telephone Encounter (Signed)
Routed to Dr. Dulce Sellar for further review. Echo results were printed and handed to him as well.

## 2019-06-10 NOTE — Telephone Encounter (Signed)
Pt wanted to let Dr. Dulce Sellar know that he had an Echocardiogram done at [facility] done today (06/10/19). The patient just wanted to make Dr. Dulce Sellar aware because the Echo was ordered by Dr. Sol Passer

## 2019-06-25 NOTE — Progress Notes (Signed)
Cardiology Office Note:    Date:  06/26/2019   ID:  Kirk Frazier, DOB 08-Sep-1955, MRN [id number]  PCP:  Kirk Bass, MD  Cardiologist:  Kirk Herrlich, MD    Referring MD: Kirk Bass, MD    ASSESSMENT:    1. CAD in native artery   2. Cerebrovascular accident (CVA), unspecified mechanism (HCC)   3. Benign hypertension   4. Chronic diastolic heart failure (HCC)   5. Hyperlipidemia, unspecified hyperlipidemia type    PLAN:    In order of problems listed above:  1. His clinical problem is stroke multiple MRI raises concern of embolism further evaluation with a 30-day live monitor as well as transesophageal echocardiogram.  If AF is detected her left atrial appendage clot he benefit from lifelong anticoagulation in the interim continue his dual antiplatelet therapy. 2. Stable CAD New York Heart Association class I continue medical therapy 3. BP at target continue current treatment 4. Heart failure compensated no edema continue his current loop diuretic 5. Continue statin with CAD and stroke and will check a lipid profile goal LDL is high risk case from my perspective is less than 55 consistent with the new European guidelines   Next appointment: 6 weeks   Medication Adjustments/Labs and Tests Ordered: Current medicines are reviewed at length with the patient today.  Concerns regarding medicines are outlined above.  No orders of the defined types were placed in this encounter.  No orders of the defined types were placed in this encounter.   Chief Complaint  Patient presents with  . Follow-up    Recent stroke yes ye    History of Present Illness:    Kirk Frazier is a 64 y.o. male with a hx of  hypertension, hyperlipidemia CAD, CHF, S/P PCI and DES to LCFx 01/08/14 for Troponin Normal unstable angina, hypertension and hyperlipidemia  He was last seen 04/02/2019.  He is seen today because of recent stroke.  I reviewed records from care everywhere MRI with stroke  in the right hemisphere and he underwent carotid duplex study at [facility].  Compliance with diet, lifestyle and medications: Yes  He had an echocardiogram at [facility] performed 06/10/2019 which showed normal left ventricular function EF 60 to 65% both atria are normal in size and there is no significant valvular abnormality. Carotid duplex was also performed 06/03/2019 showing less than 50% and no hemodynamically significant stenosis in the  internal carotid artery bilaterally MRI of the head was also performed 05/28/2019 showing chronic infarcts in the right posterior temporal and parietal lobe and an abnormality in the anterior basal ganglion question subacute versus chronic infarct.  For further evaluation after discussion of benefits risks and options undergo transesophageal echocardiogram at [facility] with Dr. Servando Frazier and a 30-day event monitor.  He is having some difficulty with concentration memory but no no motor or high deficits from his strokes.  He has had some palpitation and frequent nonsustained no shortness of breath chest pain syncope or edema.  He is not taking dual antiplatelet therapy. Past Medical History:  Diagnosis Date  . Benign hypertension 02/10/2015  . CAD in native artery 07/07/2014   Overview:  PCI and DES to LCFx 01/08/14 for Troponin  Normal unstable angina  . Chronic diastolic heart failure (HCC) 07/07/2014  . Coronary artery disease 02/10/2015   Overview:  Managed CARDS 2016: CIRC 95% PCI Resolute stent LAD 40% EF 65%  . Essential hypertension 07/07/2014  . Gout 02/10/2015   Overview:  2017:  sUA=9.6  . 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 Hyperlipidemia 07/07/2014  . Nephrolithiasis 02/10/2015  . Post-traumatic osteoarthritis of both knees 03/22/2016  . Rectal bleeding 02/28/2015  . Rib pain on right side 05/31/2016   Overview:  2014: injury  . Screening for colon cancer 05/30/2016  . Screening for prostate cancer 05/30/2016    . Type 2 diabetes mellitus without complication, without long-term current use of insulin ([place]) 02/10/2015   Overview:  2016: 6.5 2017: 412 random with A1C 7.7, started MTF  . Wellness examination 05/30/2016    Past Surgical History:  Procedure Laterality Date  . CARPAL TUNNEL RELEASE    . CORONARY ANGIOPLASTY WITH STENT PLACEMENT    . KNEE SURGERY    . TRIGGER FINGER RELEASE      Current Medications: Current Meds  Medication Sig  . allopurinol (ZYLOPRIM) 100 MG tablet Take 100 mg by mouth daily.   Marland Kitchen aspirin EC 81 MG tablet Take 162 mg by mouth daily.  Marland Kitchen atorvastatin (LIPITOR) 80 MG tablet TAKE 1 TABLET BY MOUTH EVERY DAY  . Colchicine (MITIGARE) 0.6 MG CAPS Take 0.6 mg by mouth every morning.  . ezetimibe (ZETIA) 10 MG tablet Take 10 mg by mouth daily.  Marland Kitchen FARXIGA 10 MG TABS tablet Take 10 mg by mouth every morning.  . fluorouracil (EFUDEX) 5 % cream Apply 1 application topically daily.  . Ivermectin (SOOLANTRA) 1 % CREA Apply 1 application topically daily.  . metFORMIN (GLUCOPHAGE-XR) 500 MG 24 hr tablet Take 1,000 mg by mouth 2 (two) times daily.   . metoprolol tartrate (LOPRESSOR) 25 MG tablet TAKE 1 TABLET BY MOUTH TWICE A DAY  . nitroGLYCERIN (NITROSTAT) 0.4 MG SL tablet PLACE 1 TABLET (0.4 MG TOTAL) UNDER THE TONGUE EVERY FIVE (5) MINUTES AS NEEDED FOR CHEST PAIN.  Marland Kitchen potassium chloride (KLOR-CON) 10 MEQ tablet Take 1 tablet (10 mEq total) by mouth daily.  . tadalafil (CIALIS) 10 MG tablet Take 10 mg by mouth daily as needed for erectile dysfunction.  . tamsulosin (FLOMAX) 0.4 MG CAPS capsule Take 0.4 mg by mouth 2 (two) times daily.  Marland Kitchen torsemide (DEMADEX) 20 MG tablet TAKE 1 TABLET (20 MG TOTAL) BY MOUTH 2 (TWO) TIMES DAILY. INSUR WILL PAY ON 02/21/19  . triamcinolone cream (KENALOG) 0.1 % as needed.  . valACYclovir (VALTREX) 500 MG tablet TAKE 4 TABLETS BY MOUTH EVERY 12 HOURS FOR 1 DAY (START AFTER SYMPTOMS)     Allergies:   Morphine and Pseudoephedrine hcl   Social History    Socioeconomic History  . Marital status: Significant Other    Spouse name: Not on file  . Number of children: Not on file  . Years of education: Not on file  . Highest education level: Not on file  Occupational History  . Not on file  Tobacco Use  . Smoking status: Former Research scientist (life sciences)  . Smokeless tobacco: Never Used  Vaping Use  . Vaping Use: Never used  Substance and Sexual Activity  . Alcohol use: No  . Drug use: No  . Sexual activity: Not on file  Other Topics Concern  . Not on file  Social History Narrative  . Not on file   Social Determinants of Health   Financial Resource Strain:   . Difficulty of Paying Living Expenses:   Food Insecurity:   . Worried About Charity fundraiser in the Last Year:   . [place] in the Last Year:  Transportation Needs:   . Freight forwarder (Medical):   Marland Kitchen Lack of Transportation (Non-Medical):   Physical Activity:   . Days of Exercise per Week:   . Minutes of Exercise per Session:   Stress:   . Feeling of Stress :   Social Connections:   . Frequency of Communication with Friends and Family:   . Frequency of Social Gatherings with Friends and Family:   . Attends Religious Services:   . Active Member of Clubs or Organizations:   . Attends Banker Meetings:   Marland Kitchen Marital Status:      Family History: The patient's family history includes Cancer in his father; Diabetes in his brother and father; Heart attack in his brother; Heart failure in his father and mother; Hypertension in his mother; Thyroid disease in his sister. ROS:   Please see the history of present illness.    All other systems reviewed and are negative.  EKGs/Labs/Other Studies Reviewed:    The following studies were reviewed today:   Physical Exam:    VS:  BP 124/70 (BP Location: Right Arm, Patient Position: Sitting, Cuff Size: Normal)   Pulse 64   Ht 5\' 9"  (1.753 m)   Wt 283 lb 9.6 oz (128.6 kg)   SpO2 96%   BMI 41.88 kg/m     Wt  Readings from Last 3 Encounters:  06/26/19 283 lb 9.6 oz (128.6 kg)  04/02/19 284 lb 9.6 oz (129.1 kg)  03/04/18 296 lb 3.2 oz (134.4 kg)     GEN:  Well nourished, well developed in no acute distress HEENT: Normal NECK: No JVD; No carotid bruits LYMPHATICS: No lymphadenopathy CARDIAC: RRR, no murmurs, rubs, gallops RESPIRATORY:  Clear to auscultation without rales, wheezing or rhonchi  ABDOMEN: Soft, non-tender, non-distended MUSCULOSKELETAL:  No edema; No deformity  SKIN: Warm and dry NEUROLOGIC:  Alert and oriented x 3 PSYCHIATRIC:  Normal affect    Signed, 05/04/18, MD  06/26/2019 8:41 AM    [facility]

## 2019-06-26 ENCOUNTER — Other Ambulatory Visit: Payer: Self-pay

## 2019-06-26 ENCOUNTER — Ambulatory Visit (INDEPENDENT_AMBULATORY_CARE_PROVIDER_SITE_OTHER): Payer: BC Managed Care – PPO

## 2019-06-26 ENCOUNTER — Encounter: Payer: Self-pay | Admitting: Cardiology

## 2019-06-26 ENCOUNTER — Ambulatory Visit: Payer: BC Managed Care – PPO | Admitting: Cardiology

## 2019-06-26 VITALS — BP 124/70 | HR 64 | Ht 69.0 in | Wt 283.6 lb

## 2019-06-26 DIAGNOSIS — I10 Essential (primary) hypertension: Secondary | ICD-10-CM | POA: Diagnosis not present

## 2019-06-26 DIAGNOSIS — I639 Cerebral infarction, unspecified: Secondary | ICD-10-CM

## 2019-06-26 DIAGNOSIS — E785 Hyperlipidemia, unspecified: Secondary | ICD-10-CM

## 2019-06-26 DIAGNOSIS — I2510 Atherosclerotic heart disease of native coronary artery without angina pectoris: Secondary | ICD-10-CM | POA: Diagnosis not present

## 2019-06-26 DIAGNOSIS — I5032 Chronic diastolic (congestive) heart failure: Secondary | ICD-10-CM

## 2019-06-26 NOTE — Patient Instructions (Addendum)
Medication Instructions:  Your physician recommends that you continue on your current medications as directed. Please refer to the Current Medication list given to you today.  *If you need a refill on your cardiac medications before your next appointment, please call your pharmacy*   Lab Work: Your physician recommends that you return for lab work in: TODAY CBC, BMP, INR, Lipids If you have labs (blood work) drawn today and your tests are completely normal, you will receive your results only by: Marland Kitchen MyChart Message (if you have MyChart) OR . A paper copy in the mail If you have any lab test that is abnormal or we need to change your treatment, we will call you to review the results.   Testing/Procedures: You are scheduled for a TEE on 07/27/19 with Dr. Servando Salina.  Please arrive at [facility] [address] [place], [place] [address] at 8:30 am.  DIET: Nothing to eat or drink after midnight except a sip of water with medications (see medication instructions below)  Medication Instructions: Hold Metformin/Farxiga  Continue your anticoagulant: Aspirin You will need to continue your anticoagulant after your procedure until you are told by your  Provider that it is safe to stop   Labs: CBC/BMP on 07/20/19-07/24/19  You will need to have a COVID swab completed prior to this procedure. The COVID swab can be done at any urgent care and will need to be brought in to our office about 3 days prior to the procedure.   You must have a responsible person to drive you home and stay in the waiting area during your procedure. Failure to do so could result in cancellation.  Bring your insurance cards.  *Special Note: Every effort is made to have your procedure done on time. Occasionally there are emergencies that occur at the hospital that may cause delays. Please be patient if a delay does occur.     Follow-Up: At [facility], you and your health needs are our priority.  As part of our continuing  mission to provide you with exceptional heart care, we have created designated Provider Care Teams.  These Care Teams include your primary Cardiologist (physician) and Advanced Practice Providers (APPs -  Physician Assistants and Nurse Practitioners) who all work together to provide you with the care you need, when you need it.  We recommend signing up for the patient portal called "MyChart".  Sign up information is provided on this After Visit Summary.  MyChart is used to connect with patients for Virtual Visits (Telemedicine).  Patients are able to view lab/test results, encounter notes, upcoming appointments, etc.  Non-urgent messages can be sent to your provider as well.   To learn more about what you can do with MyChart, go to [email or web address].    Your next appointment:   6 week(s)  The format for your next appointment:   In Person  Provider:   Norman Herrlich, MD   Other Instructions

## 2019-06-27 LAB — CBC
Hematocrit: 47.2 % (ref 37.5–51.0)
Hemoglobin: 16.5 g/dL (ref 13.0–17.7)
MCH: 30.4 pg (ref 26.6–33.0)
MCHC: 35 g/dL (ref 31.5–35.7)
MCV: 87 fL (ref 79–97)
Platelets: 209 10*3/uL (ref 150–450)
RBC: 5.42 x10E6/uL (ref 4.14–5.80)
RDW: 13.2 % (ref 11.6–15.4)
WBC: 5.6 10*3/uL (ref 3.4–10.8)

## 2019-06-27 LAB — BASIC METABOLIC PANEL
BUN/Creatinine Ratio: 16 (ref 10–24)
BUN: 21 mg/dL (ref 8–27)
CO2: 25 mmol/L (ref 20–29)
Calcium: 9.4 mg/dL (ref 8.6–10.2)
Chloride: 99 mmol/L (ref 96–106)
Creatinine, Ser: 1.29 mg/dL — ABNORMAL HIGH (ref 0.76–1.27)
GFR calc Af Amer: 68 mL/min/{1.73_m2} (ref >59)
GFR calc non Af Amer: 59 mL/min/{1.73_m2} — ABNORMAL LOW (ref >59)
Glucose: 193 mg/dL — ABNORMAL HIGH (ref 65–99)
Potassium: 4.1 mmol/L (ref 3.5–5.2)
Sodium: 140 mmol/L (ref 134–144)

## 2019-06-27 LAB — LIPID PANEL
Chol/HDL Ratio: 2.9 ratio (ref 0.0–5.0)
Cholesterol, Total: 116 mg/dL (ref 100–199)
HDL: 40 mg/dL (ref >39)
LDL Chol Calc (NIH): 55 mg/dL (ref 0–99)
Triglycerides: 114 mg/dL (ref 0–149)
VLDL Cholesterol Cal: 21 mg/dL (ref 5–40)

## 2019-06-27 LAB — PROTIME-INR
INR: 1 (ref 0.9–1.2)
Prothrombin Time: 10.6 s (ref 9.1–12.0)

## 2019-06-29 ENCOUNTER — Telehealth: Payer: Self-pay

## 2019-06-29 NOTE — Telephone Encounter (Signed)
-----   Message from Baldo Daub, MD sent at 06/27/2019 12:08 PM EDT ----- Normal or stable result  No changes

## 2019-06-29 NOTE — Telephone Encounter (Signed)
Spoke with patient regarding results and recommendation.  Patient verbalizes understanding and is agreeable to plan of care. Advised patient to call back with any issues or concerns.  

## 2019-07-14 ENCOUNTER — Telehealth: Payer: Self-pay | Admitting: Cardiology

## 2019-07-14 NOTE — Telephone Encounter (Signed)
Spoke to patient and let him know that he was suppose to wear this monitor for 30 days. He verbalizes understanding and thanks me for the call back.

## 2019-07-14 NOTE — Telephone Encounter (Signed)
New message  Patient is calling in to see how long he is supposed to be wearing the monitor. Please call and advise.

## 2019-07-16 ENCOUNTER — Telehealth: Payer: Self-pay

## 2019-07-16 NOTE — Telephone Encounter (Signed)
Spoke to the patient just now and let him know that his TEE has been rescheduled to 07/23/19 and he needs to arrive at 11 AM to register. He states that he is going to get his COVID swab done tomorrow and will bring [place] those results when he is done at the urgent care. No other issues or concerns were noted and he thanks me for the call.   Encouraged patient to call back with any questions or concerns.

## 2019-07-22 ENCOUNTER — Telehealth: Payer: Self-pay

## 2019-07-22 DIAGNOSIS — I5032 Chronic diastolic (congestive) heart failure: Secondary | ICD-10-CM

## 2019-07-22 DIAGNOSIS — I10 Essential (primary) hypertension: Secondary | ICD-10-CM

## 2019-07-22 LAB — BASIC METABOLIC PANEL
BUN/Creatinine Ratio: 18 (ref 10–24)
BUN: 23 mg/dL (ref 8–27)
CO2: 24 mmol/L (ref 20–29)
Calcium: 9.4 mg/dL (ref 8.6–10.2)
Chloride: 100 mmol/L (ref 96–106)
Creatinine, Ser: 1.28 mg/dL — ABNORMAL HIGH (ref 0.76–1.27)
GFR calc Af Amer: 68 mL/min/{1.73_m2} (ref >59)
GFR calc non Af Amer: 59 mL/min/{1.73_m2} — ABNORMAL LOW (ref >59)
Glucose: 177 mg/dL — ABNORMAL HIGH (ref 65–99)
Potassium: 4.1 mmol/L (ref 3.5–5.2)
Sodium: 141 mmol/L (ref 134–144)

## 2019-07-22 LAB — CBC
Hematocrit: 49.3 % (ref 37.5–51.0)
Hemoglobin: 17 g/dL (ref 13.0–17.7)
MCH: 31.5 pg (ref 26.6–33.0)
MCHC: 34.5 g/dL (ref 31.5–35.7)
MCV: 92 fL (ref 79–97)
Platelets: 197 10*3/uL (ref 150–450)
RBC: 5.39 x10E6/uL (ref 4.14–5.80)
RDW: 13.2 % (ref 11.6–15.4)
WBC: 5.2 10*3/uL (ref 3.4–10.8)

## 2019-07-22 NOTE — Telephone Encounter (Signed)
Telephone note started in order to place lab orders for this patient.  

## 2019-07-23 ENCOUNTER — Telehealth: Payer: Self-pay | Admitting: Cardiology

## 2019-07-23 ENCOUNTER — Encounter: Payer: Self-pay | Admitting: Cardiology

## 2019-07-23 DIAGNOSIS — I749 Embolism and thrombosis of unspecified artery: Secondary | ICD-10-CM

## 2019-07-23 DIAGNOSIS — I6389 Other cerebral infarction: Secondary | ICD-10-CM

## 2019-07-23 NOTE — Telephone Encounter (Signed)
Ava with [facility] is requesting an order for an echocardiogram.

## 2019-07-23 NOTE — Telephone Encounter (Signed)
Order form faxed at this time

## 2019-07-29 ENCOUNTER — Ambulatory Visit: Payer: BC Managed Care – PPO | Admitting: Cardiology

## 2019-08-02 NOTE — Progress Notes (Signed)
Cardiology Office Note:    Date:  08/03/2019   ID:  Kirk Frazier, DOB July 08, 1955, MRN [id number]  PCP:  Olive Bass, MD  Cardiologist:  Norman Herrlich, MD    Referring MD: Olive Bass, MD    ASSESSMENT:    1. Cerebrovascular accident (CVA), unspecified mechanism (HCC)   2. 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 Chronic diastolic heart failure (HCC)   4. Coronary artery disease with stable angina pectoris, unspecified vessel or lesion type, unspecified whether native or transplanted heart (HCC)   5. Hyperlipidemia, unspecified hyperlipidemia type    PLAN:    In order of problems listed above:  1. Evaluation shows no evidence of cardiac source of embolism.  I did ask him to purchase the adapter for the iPhone screen his heart rhythm daily for atrial fibrillation. 2. BP at target heart failure compensated continue his current diuretic 3. Stable CAD having no angina on current medical therapy 4. Stable dyslipidemia continue high intensity statin   Next appointment: 6 months   Medication Adjustments/Labs and Tests Ordered: Current medicines are reviewed at length with the patient today.  Concerns regarding medicines are outlined above.  No orders of the defined types were placed in this encounter.  No orders of the defined types were placed in this encounter.   Chief Complaint  Patient presents with  . Follow-up    History of Present Illness:    Kirk Frazier is a 64 y.o. male with a hx of  hypertension, hyperlipidemia CAD, CHF, S/P PCI and DES to LCFx 01/08/14 for Troponin Normal unstable angina, hypertension and hyperlipidemia  He was last seen 06/26/2019 because of recent stroke.  I reviewed records from care everywhere MRI with stroke in the right hemisphere and he underwent carotid duplex study at [facility]. He had an echocardiogram at [facility] performed 06/10/2019 which showed normal left  ventricular function EF 60 to 65% both atria are normal in size and there is no significant valvular abnormality. Carotid duplex was also performed 06/03/2019 showing less than 50% and no hemodynamically significant stenosis in the  internal carotid artery bilaterally MRI of the head was also performed 05/28/2019 showing chronic infarcts in the right posterior temporal and parietal lobe and an abnormality in the anterior basal ganglion question subacute versus chronic infarct. He had a transesophageal echocardiogram at [facility] with no findings of cardioembolic source.  I reviewed his 14-day monitor available online that shows no episodes of atrial fibrillation.  Compliance with diet, lifestyle and medications: Yes  He has made a good recovery from his stroke we have no documentation of cardiac source of embolism at this point I would not implant a loop recorder but I asked him to purchase the iPhone adapter and screen his heart rhythm daily.  There is a question of abnormality of liver contour on gastric views I was going to order a duplex however he tells me he just had a CT of his abdomen at [facility] we will request the records.  I received the CT scan done 02/02/2019 which showed mild hepatic steatosis.  There were no suspicious or enhancing gallbladder lesions.  He has had no palpitation shortness of breath chest pain. Past Medical History:  Diagnosis Date  . Benign hypertension 02/10/2015  . CAD in native artery 07/07/2014   Overview:  PCI and DES to LCFx 01/08/14 for Troponin  Normal unstable angina  . Chronic diastolic heart failure (HCC) 07/07/2014  .  Coronary artery disease 02/10/2015   Overview:  Managed CARDS 2016: CIRC 95% PCI Resolute stent LAD 40% EF 65%  . Essential hypertension 07/07/2014  . Gout 02/10/2015   Overview:  2017: sUA=9.6  . 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 Hyperlipidemia 07/07/2014  . Nephrolithiasis 02/10/2015  .  Post-traumatic osteoarthritis of both knees 03/22/2016  . Rectal bleeding 02/28/2015  . Rib pain on right side 05/31/2016   Overview:  2014: injury  . Screening for colon cancer 05/30/2016  . Screening for prostate cancer 05/30/2016  . Type 2 diabetes mellitus without complication, without long-term current use of insulin (HCC) 02/10/2015   Overview:  2016: 6.5 2017: 412 random with A1C 7.7, started MTF  . Wellness examination 05/30/2016    Past Surgical History:  Procedure Laterality Date  . CARPAL TUNNEL RELEASE    . CORONARY ANGIOPLASTY WITH STENT PLACEMENT    . KNEE SURGERY    . TRIGGER FINGER RELEASE      Current Medications: Current Meds  Medication Sig  . allopurinol (ZYLOPRIM) 100 MG tablet Take 100 mg by mouth daily.   Marland Kitchen aspirin EC 81 MG tablet Take 81 mg by mouth daily.   Marland Kitchen atorvastatin (LIPITOR) 80 MG tablet TAKE 1 TABLET BY MOUTH EVERY DAY  . Colchicine (MITIGARE) 0.6 MG CAPS Take 0.6 mg by mouth every morning.  . ezetimibe (ZETIA) 10 MG tablet Take 10 mg by mouth daily.  Marland Kitchen FARXIGA 10 MG TABS tablet Take 10 mg by mouth every morning.  . fluorouracil (EFUDEX) 5 % cream Apply 1 application topically daily.  . Ivermectin (SOOLANTRA) 1 % CREA Apply 1 application topically daily.  . metFORMIN (GLUCOPHAGE-XR) 500 MG 24 hr tablet Take 1,000 mg by mouth 2 (two) times daily.   . metoprolol tartrate (LOPRESSOR) 25 MG tablet TAKE 1 TABLET BY MOUTH TWICE A DAY  . nitroGLYCERIN (NITROSTAT) 0.4 MG SL tablet PLACE 1 TABLET (0.4 MG TOTAL) UNDER THE TONGUE EVERY FIVE (5) MINUTES AS NEEDED FOR CHEST PAIN.  Marland Kitchen potassium chloride (KLOR-CON) 10 MEQ tablet Take 1 tablet (10 mEq total) by mouth daily.  . tadalafil (CIALIS) 10 MG tablet Take 10 mg by mouth daily as needed for erectile dysfunction.  . tamsulosin (FLOMAX) 0.4 MG CAPS capsule Take 0.4 mg by mouth 2 (two) times daily.  Marland Kitchen torsemide (DEMADEX) 20 MG tablet Take 20 mg by mouth daily. Take twice a day if needed  . triamcinolone cream  (KENALOG) 0.1 % as needed.  . valACYclovir (VALTREX) 500 MG tablet TAKE 4 TABLETS BY MOUTH EVERY 12 HOURS FOR 1 DAY (START AFTER SYMPTOMS)  . [DISCONTINUED] torsemide (DEMADEX) 20 MG tablet TAKE 1 TABLET (20 MG TOTAL) BY MOUTH 2 (TWO) TIMES DAILY. INSUR WILL PAY ON 02/21/19 (Patient taking differently: Take 20 mg by mouth daily. )     Allergies:   Morphine and Pseudoephedrine hcl   Social History   Socioeconomic History  . Marital status: Significant Other    Spouse name: Not on file  . Number of children: Not on file  . Years of education: Not on file  . Highest education level: Not on file  Occupational History  . Not on file  Tobacco Use  . Smoking status: Former Games developer  . Smokeless tobacco: Never Used  Vaping Use  . Vaping Use: Never used  Substance and Sexual Activity  . Alcohol use: No  . Drug use: No  . Sexual activity: Not on file  Other  Topics Concern  . Not on file  Social History Narrative  . Not on file   Social Determinants of Health   Financial Resource Strain:   . Difficulty of Paying Living Expenses:   Food Insecurity:   . Worried About Programme researcher, broadcasting/film/video in the Last Year:   . Barista in the Last Year:   Transportation Needs:   . Freight forwarder (Medical):   Marland Kitchen Lack of Transportation (Non-Medical):   Physical Activity:   . Days of Exercise per Week:   . Minutes of Exercise per Session:   Stress:   . Feeling of Stress :   Social Connections:   . Frequency of Communication with Friends and Family:   . Frequency of Social Gatherings with Friends and Family:   . Attends Religious Services:   . Active Member of Clubs or Organizations:   . Attends Banker Meetings:   Marland Kitchen Marital Status:      Family History: The patient's family history includes Cancer in his father; Diabetes in his brother and father; Heart attack in his brother; Heart failure in his father and mother; Hypertension in his mother; Thyroid disease in his  sister. ROS:   Please see the history of present illness.    All other systems reviewed and are negative.  EKGs/Labs/Other Studies Reviewed:    The following studies were reviewed today:   Recent Labs: 07/22/2019: BUN 23; Creatinine, Ser 1.28; Hemoglobin 17.0; Platelets 197; Potassium 4.1; Sodium 141  Recent Lipid Panel    Component Value Date/Time   CHOL 116 06/26/2019 0918   TRIG 114 06/26/2019 0918   HDL 40 06/26/2019 0918   CHOLHDL 2.9 06/26/2019 0918   LDLCALC 55 06/26/2019 0918    Physical Exam:    VS:  BP 110/70 (BP Location: Right Arm, Patient Position: Sitting, Cuff Size: Large)   Pulse 58   Ht 5\' 9"  (1.753 m)   Wt (!) 283 lb (128.4 kg)   SpO2 94%   BMI 41.79 kg/m     Wt Readings from Last 3 Encounters:  08/03/19 (!) 283 lb (128.4 kg)  06/26/19 283 lb 9.6 oz (128.6 kg)  04/02/19 284 lb 9.6 oz (129.1 kg)     GEN:  Well nourished, well developed in no acute distress HEENT: Normal NECK: No JVD; No carotid bruits LYMPHATICS: No lymphadenopathy CARDIAC: RRR, no murmurs, rubs, gallops RESPIRATORY:  Clear to auscultation without rales, wheezing or rhonchi  ABDOMEN: Soft, non-tender, non-distended MUSCULOSKELETAL:  No edema; No deformity  SKIN: Warm and dry NEUROLOGIC:  Alert and oriented x 3 PSYCHIATRIC:  Normal affect    Signed, 06/02/19, MD  08/03/2019 8:33 AM    [facility]

## 2019-08-03 ENCOUNTER — Encounter: Payer: Self-pay | Admitting: Cardiology

## 2019-08-03 ENCOUNTER — Ambulatory Visit: Payer: BC Managed Care – PPO | Admitting: Cardiology

## 2019-08-03 ENCOUNTER — Other Ambulatory Visit: Payer: Self-pay

## 2019-08-03 VITALS — BP 110/70 | HR 58 | Ht 69.0 in | Wt 283.0 lb

## 2019-08-03 DIAGNOSIS — I110 Hypertensive heart disease with heart failure: Secondary | ICD-10-CM

## 2019-08-03 DIAGNOSIS — I639 Cerebral infarction, unspecified: Secondary | ICD-10-CM | POA: Diagnosis not present

## 2019-08-03 DIAGNOSIS — E785 Hyperlipidemia, unspecified: Secondary | ICD-10-CM

## 2019-08-03 DIAGNOSIS — I5032 Chronic diastolic (congestive) heart failure: Secondary | ICD-10-CM

## 2019-08-03 DIAGNOSIS — I25118 Atherosclerotic heart disease of native coronary artery with other forms of angina pectoris: Secondary | ICD-10-CM | POA: Diagnosis not present

## 2019-08-03 NOTE — Patient Instructions (Addendum)
Medication Instructions:  Your physician recommends that you continue on your current medications as directed. Please refer to the Current Medication list given to you today.  *If you need a refill on your cardiac medications before your next appointment, please call your pharmacy*   Lab Work: None If you have labs (blood work) drawn today and your tests are completely normal, you will receive your results only by: . MyChart Message (if you have MyChart) OR . A paper copy in the mail If you have any lab test that is abnormal or we need to change your treatment, we will call you to review the results.   Testing/Procedures: None   Follow-Up: At [facility], you and your health needs are our priority.  As part of our continuing mission to provide you with exceptional heart care, we have created designated Provider Care Teams.  These Care Teams include your primary Cardiologist (physician) and Advanced Practice Providers (APPs -  Physician Assistants and Nurse Practitioners) who all work together to provide you with the care you need, when you need it.  We recommend signing up for the patient portal called "MyChart".  Sign up information is provided on this After Visit Summary.  MyChart is used to connect with patients for Virtual Visits (Telemedicine).  Patients are able to view lab/test results, encounter notes, upcoming appointments, etc.  Non-urgent messages can be sent to your provider as well.   To learn more about what you can do with MyChart, go to [email or web address].    Your next appointment:   6 month(s)  The format for your next appointment:   In Person  Provider:   Alyzae Hawkey, MD   Other Instructions  KardiaMobile [email or web address]        FDA-cleared, clinical grade mobile EKG monitor: Kardia is the most clinically-validated mobile EKG used by the world's leading cardiac care medical professionals With Basic service, know  instantly if your heart rhythm is normal or if atrial fibrillation is detected, and email the last single EKG recording to yourself or your doctor Premium service, available for purchase through the Kardia app for $9.99 per month or $99 per year, includes unlimited history and storage of your EKG recordings, a monthly EKG summary report to share with your doctor, along with the ability to track your blood pressure, activity and weight Includes one KardiaMobile phone clip FREE SHIPPING: Standard delivery 1-3 business days. Orders placed by 11:00am PST will ship that afternoon. Otherwise, will ship next business day. All orders ship via USPS Priority Mail from [place], [place]     

## 2019-10-21 ENCOUNTER — Other Ambulatory Visit: Payer: Self-pay | Admitting: Cardiology

## 2019-11-02 ENCOUNTER — Other Ambulatory Visit: Payer: Self-pay | Admitting: Cardiology

## 2020-01-24 ENCOUNTER — Other Ambulatory Visit: Payer: Self-pay | Admitting: Cardiology

## 2020-01-25 ENCOUNTER — Other Ambulatory Visit: Payer: Self-pay

## 2020-01-25 MED ORDER — ATORVASTATIN CALCIUM 80 MG PO TABS: 80.0000 mg | ORAL_TABLET | Freq: Every day | ORAL | 1 refills | Status: DC

## 2020-01-25 NOTE — Telephone Encounter (Signed)
Refill sent to pharmacy.   

## 2020-01-25 NOTE — Telephone Encounter (Signed)
Refill for Atorvastatin 80 mg sent to [facility].

## 2020-01-27 ENCOUNTER — Other Ambulatory Visit: Payer: Self-pay

## 2020-01-27 ENCOUNTER — Other Ambulatory Visit: Payer: Self-pay | Admitting: Cardiology

## 2020-01-27 MED ORDER — ATORVASTATIN CALCIUM 80 MG PO TABS: 80.0000 mg | ORAL_TABLET | Freq: Every day | ORAL | 1 refills | Status: DC

## 2020-01-27 NOTE — Telephone Encounter (Signed)
Refill of Atorvastatin 80 mg sent to [facility].

## 2020-01-27 NOTE — Telephone Encounter (Signed)
Refill sent to pharmacy.   

## 2020-02-01 NOTE — Progress Notes (Unsigned)
Cardiology Office Note:    Date:  02/02/2020   ID:  [name], DOB 10/29/1955, MRN [id number]  PCP:  Olive Bass, MD  Cardiologist:  Norman Herrlich, MD    Referring MD: Olive Bass, MD    ASSESSMENT:    1. Hypertensive heart disease with chronic diastolic congestive heart failure (HCC)   2. Hyperlipidemia, unspecified hyperlipidemia type   3. Coronary artery disease with stable angina pectoris, unspecified vessel or lesion type, unspecified whether native or transplanted heart (HCC)    PLAN:    In order of problems listed above:  1. BP is at target heart failure is decompensated fluid overloaded will increase his diuretics and for compliance I told him to take the second dose about 12 noon as he had trouble being up at night with nocturia.  No labs drawn today he will need a proBNP level.  I will see him back in about 1 month in the office.  He will continue his beta-blocker SGLT2 inhibitor that he takes for type 2 diabetes 2. Continue combined statin plus Zetia therapy high risk with CAD 3. CAD continue medical therapy aspirin statin beta-blocker New York Heart Association class I no angina on current therapy at this time I would not advise an ischemia evaluation 4. Type 2 diabetes for follow-up with his PCP on appropriate cardioprotective medication with Metformin and SGLT2 inhibitor   Next appointment: 4 weeks   Medication Adjustments/Labs and Tests Ordered: Current medicines are reviewed at length with the patient today.  Concerns regarding medicines are outlined above.  No orders of the defined types were placed in this encounter.  No orders of the defined types were placed in this encounter.   Chief Complaint  Patient presents with  . Follow-up    Follow-up heart failure weight gain having orthopnea    History of Present Illness:    Kirk Frazier is a 65 y.o. male with a hx of hypertension hyperlipidemia CAD with drug-eluting stent to left circumflex  2016 for unstable angina hypertension hyperlipidemia and stroke June 2021.  His evaluation showed a normal echocardiogram EF 60 to 65% both atria are normal in size and cerebrovascular duplex showed less than 50% stenosis in the internal carotid artery bilaterally.  He also had a transesophageal echocardiogram [facility] with no findings of cardioembolic stroke.  He was last seen 08/03/2019.  Compliance with diet, lifestyle and medications: Yes however he had reduced his diuretic dosage sometime in the last year because of urinary frequency.  Recently he notices he is more short of breath he is having orthopnea but no PND he is not weighing daily but he thinks his weight is up somewhere in the range of 20 pounds.  He is unaware that he has edema.  By our office scale his weight is stable.  He is not short of breath during the day no chest pain palpitation or syncope.  Surprisingly he does not snore does not have a history of sleep apnea despite BMI of 42.  Later today seeing his PCP he will have labs drawn I like to get a proBNP level.  On physical exam he is fluid overloaded he will increase his diuretic to twice daily for 1 week and then alternate 20 and 40 mg daily. Past Medical History:  Diagnosis Date  . Benign hypertension 02/10/2015  . CAD in native artery 07/07/2014   Overview:  PCI and DES to LCFx 01/08/14 for Troponin  Normal unstable angina  . Chronic diastolic  heart failure (HCC) 07/07/2014  . Coronary artery disease 02/10/2015   Overview:  Managed CARDS 2016: CIRC 95% PCI Resolute stent LAD 40% EF 65%  . Essential hypertension 07/07/2014  . Gout 02/10/2015   Overview:  2017: sUA=9.6  . 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 Hyperlipidemia 07/07/2014  . Nephrolithiasis 02/10/2015  . Post-traumatic osteoarthritis of both knees 03/22/2016  . Rectal bleeding 02/28/2015  . Rib pain on right side 05/31/2016   Overview:  2014: injury  . Screening for colon cancer  05/30/2016  . Screening for prostate cancer 05/30/2016  . Type 2 diabetes mellitus without complication, without long-term current use of insulin (HCC) 02/10/2015   Overview:  2016: 6.5 2017: 412 random with A1C 7.7, started MTF  . Wellness examination 05/30/2016    Past Surgical History:  Procedure Laterality Date  . CARPAL TUNNEL RELEASE    . CORONARY ANGIOPLASTY WITH STENT PLACEMENT    . KNEE SURGERY    . TRIGGER FINGER RELEASE      Current Medications: Current Meds  Medication Sig  . allopurinol (ZYLOPRIM) 100 MG tablet Take 100 mg by mouth daily.   Marland Kitchen aspirin EC 81 MG tablet Take 81 mg by mouth daily.   Marland Kitchen atorvastatin (LIPITOR) 80 MG tablet Take 1 tablet (80 mg total) by mouth daily.  Marland Kitchen ezetimibe (ZETIA) 10 MG tablet Take 10 mg by mouth daily.  Marland Kitchen FARXIGA 10 MG TABS tablet Take 10 mg by mouth every morning.  . fluorouracil (EFUDEX) 5 % cream Apply 1 application topically daily.  . Ivermectin 1 % CREA Apply 1 application topically daily.  . metFORMIN (GLUCOPHAGE-XR) 500 MG 24 hr tablet Take 1,000 mg by mouth 2 (two) times daily.   . metoprolol tartrate (LOPRESSOR) 25 MG tablet TAKE 1 TABLET BY MOUTH TWICE A DAY  . nitroGLYCERIN (NITROSTAT) 0.4 MG SL tablet PLACE 1 TABLET (0.4 MG TOTAL) UNDER THE TONGUE EVERY FIVE (5) MINUTES AS NEEDED FOR CHEST PAIN.  Marland Kitchen potassium chloride (KLOR-CON) 10 MEQ tablet TAKE 1 TABLET BY MOUTH EVERY DAY  . tadalafil (CIALIS) 10 MG tablet Take 10 mg by mouth daily as needed for erectile dysfunction.  . tamsulosin (FLOMAX) 0.4 MG CAPS capsule Take 0.4 mg by mouth 2 (two) times daily.  Marland Kitchen torsemide (DEMADEX) 20 MG tablet TAKE 1 TABLET (20 MG TOTAL) BY MOUTH 2 (TWO) TIMES DAILY. INSUR WILL PAY ON 02/21/19  . triamcinolone cream (KENALOG) 0.1 % as needed.  . valACYclovir (VALTREX) 500 MG tablet TAKE 4 TABLETS BY MOUTH EVERY 12 HOURS FOR 1 DAY (START AFTER SYMPTOMS)     Allergies:   Morphine and Pseudoephedrine hcl   Social History   Socioeconomic History  .  Marital status: Significant Other    Spouse name: Not on file  . Number of children: Not on file  . Years of education: Not on file  . Highest education level: Not on file  Occupational History  . Not on file  Tobacco Use  . Smoking status: Former Games developer  . Smokeless tobacco: Never Used  Vaping Use  . Vaping Use: Never used  Substance and Sexual Activity  . Alcohol use: No  . Drug use: No  . Sexual activity: Not on file  Other Topics Concern  . Not on file  Social History Narrative  . Not on file   Social Determinants of Health   Financial Resource Strain: Not on file  Food Insecurity: Not on file  Transportation Needs:  Not on file  Physical Activity: Not on file  Stress: Not on file  Social Connections: Not on file     Family History: The patient's family history includes Cancer in his father; Diabetes in his brother and father; Heart attack in his brother; Heart failure in his father and mother; Hypertension in his mother; Thyroid disease in his sister. ROS:   Please see the history of present illness.    All other systems reviewed and are negative.  EKGs/Labs/Other Studies Reviewed:    The following studies were reviewed today:    Recent Labs: 07/22/2019: BUN 23; Creatinine, Ser 1.28; Hemoglobin 17.0; Platelets 197; Potassium 4.1; Sodium 141  Recent Lipid Panel    Component Value Date/Time   CHOL 116 06/26/2019 0918   TRIG 114 06/26/2019 0918   HDL 40 06/26/2019 0918   CHOLHDL 2.9 06/26/2019 0918   LDLCALC 55 06/26/2019 0918    Physical Exam:    VS:  BP 118/80   Pulse 70   Ht 5' 8.5" (1.74 m)   Wt 280 lb (127 kg)   SpO2 97%   BMI 41.95 kg/m     Wt Readings from Last 3 Encounters:  02/02/20 280 lb (127 kg)  08/03/19 (!) 283 lb (128.4 kg)  06/26/19 283 lb 9.6 oz (128.6 kg)     GEN: Obese well nourished, well developed in no acute distress HEENT: Normal NECK: No JVD; No carotid bruits LYMPHATICS: No lymphadenopathy CARDIAC: Distant RRR, no  murmurs, rubs, gallops RESPIRATORY:  Clear to auscultation without rales, wheezing or rhonchi  ABDOMEN: Soft, non-tender, non-distended MUSCULOSKELETAL: 1-2+ bilateral lower extremity pitting edema; No deformity  SKIN: Warm and dry NEUROLOGIC:  Alert and oriented x 3 PSYCHIATRIC:  Normal affect    Signed, Norman Herrlich, MD  02/02/2020 8:45 AM    [facility]

## 2020-02-02 ENCOUNTER — Other Ambulatory Visit: Payer: Self-pay

## 2020-02-02 ENCOUNTER — Encounter: Payer: Self-pay | Admitting: Cardiology

## 2020-02-02 ENCOUNTER — Ambulatory Visit: Payer: BC Managed Care – PPO | Admitting: Cardiology

## 2020-02-02 VITALS — BP 118/80 | HR 70 | Ht 68.5 in | Wt 280.0 lb

## 2020-02-02 DIAGNOSIS — I5032 Chronic diastolic (congestive) heart failure: Secondary | ICD-10-CM | POA: Diagnosis not present

## 2020-02-02 DIAGNOSIS — I110 Hypertensive heart disease with heart failure: Secondary | ICD-10-CM

## 2020-02-02 DIAGNOSIS — I25118 Atherosclerotic heart disease of native coronary artery with other forms of angina pectoris: Secondary | ICD-10-CM | POA: Diagnosis not present

## 2020-02-02 DIAGNOSIS — E785 Hyperlipidemia, unspecified: Secondary | ICD-10-CM

## 2020-02-02 NOTE — Patient Instructions (Signed)
Medication Instructions:  Your physician has recommended you make the following change in your medication: Please take your Torsmeide twice daily for the next week. Then alternate taking the medication once daily and twice daily every other day.  *If you need a refill on your cardiac medications before your next appointment, please call your pharmacy*   Lab Work: None If you have labs (blood work) drawn today and your tests are completely normal, you will receive your results only by: Marland Kitchen MyChart Message (if you have MyChart) OR . A paper copy in the mail If you have any lab test that is abnormal or we need to change your treatment, we will call you to review the results.   Testing/Procedures: None   Follow-Up: At [facility], you and your health needs are our priority.  As part of our continuing mission to provide you with exceptional heart care, we have created designated Provider Care Teams.  These Care Teams include your primary Cardiologist (physician) and Advanced Practice Providers (APPs -  Physician Assistants and Nurse Practitioners) who all work together to provide you with the care you need, when you need it.  We recommend signing up for the patient portal called "MyChart".  Sign up information is provided on this After Visit Summary.  MyChart is used to connect with patients for Virtual Visits (Telemedicine).  Patients are able to view lab/test results, encounter notes, upcoming appointments, etc.  Non-urgent messages can be sent to your provider as well.   To learn more about what you can do with MyChart, go to [email or web address].    Your next appointment:   4 week(s)  The format for your next appointment:   In Person  Provider:   Norman Herrlich, MD   Other Instructions

## 2020-03-09 ENCOUNTER — Ambulatory Visit: Payer: BC Managed Care – PPO | Admitting: Cardiology

## 2020-03-09 ENCOUNTER — Encounter: Payer: Self-pay | Admitting: Cardiology

## 2020-03-09 ENCOUNTER — Other Ambulatory Visit: Payer: Self-pay

## 2020-03-09 VITALS — BP 124/78 | HR 63 | Ht 68.5 in | Wt 285.6 lb

## 2020-03-09 DIAGNOSIS — I110 Hypertensive heart disease with heart failure: Secondary | ICD-10-CM

## 2020-03-09 DIAGNOSIS — I5032 Chronic diastolic (congestive) heart failure: Secondary | ICD-10-CM

## 2020-03-09 DIAGNOSIS — I25118 Atherosclerotic heart disease of native coronary artery with other forms of angina pectoris: Secondary | ICD-10-CM

## 2020-03-09 DIAGNOSIS — E785 Hyperlipidemia, unspecified: Secondary | ICD-10-CM | POA: Diagnosis not present

## 2020-03-09 NOTE — Patient Instructions (Signed)

## 2020-03-09 NOTE — Progress Notes (Signed)
Cardiology Office Note:    Date:  03/09/2020   ID:  Kirk Frazier, DOB 02/01/1955, MRN [id number]  PCP:  Olive Bass, MD  Cardiologist:  Norman Herrlich, MD    Referring MD: Olive Bass, MD    ASSESSMENT:    1. Hypertensive heart disease with chronic diastolic congestive heart failure (HCC)   2. Coronary artery disease with stable angina pectoris, unspecified vessel or lesion type, unspecified whether native or transplanted heart (HCC)   3. Hyperlipidemia, unspecified hyperlipidemia type    PLAN:    In order of problems listed above:  1. Improved BP at target and he has no evidence of volume overload today on higher dose of diuretic will continue torsemide 20 mg twice daily along with his other blood pressure agent including beta-blocker. 2. Stable CAD having no anginal discomfort continue his chronic dual antiplatelet therapy and lipid-lowering statin Zetia 3. Ideal lipids continue current treatment   Next appointment: 6 months   Medication Adjustments/Labs and Tests Ordered: Current medicines are reviewed at length with the patient today.  Concerns regarding medicines are outlined above.  No orders of the defined types were placed in this encounter.  No orders of the defined types were placed in this encounter.   No chief complaint on file.   History of Present Illness:    Kirk Frazier is a 65 y.o. male with a hx of hypertensive heart disease with chronic diastolic heart failure hyperlipidemia and CAD last seen 02/02/2020 with fluid overload and decompensated heart failure.  Compliance with diet, lifestyle and medications: Yes  Recent labs 03/01/2020 with his PCP [facility] creatinine 1.34 stable potassium 4.5 GFR 59 cc Cholesterol 130 LDL 64 triglycerides 169 HDL 45  He is improved and his weights at home is down 5 to 10 pounds compliant with his diuretic not having edema shortness of breath chest pain palpitation or syncope. Past Medical  History:  Diagnosis Date  . Benign hypertension 02/10/2015  . CAD in native artery 07/07/2014   Overview:  PCI and DES to LCFx 01/08/14 for Troponin  Normal unstable angina  . Chronic diastolic heart failure (HCC) 07/07/2014  . Coronary artery disease 02/10/2015   Overview:  Managed CARDS 2016: CIRC 95% PCI Resolute stent LAD 40% EF 65%  . Essential hypertension 07/07/2014  . Gout 02/10/2015   Overview:  2017: sUA=9.6  . 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 Hyperlipidemia 07/07/2014  . Nephrolithiasis 02/10/2015  . Post-traumatic osteoarthritis of both knees 03/22/2016  . Rectal bleeding 02/28/2015  . Rib pain on right side 05/31/2016   Overview:  2014: injury  . Screening for colon cancer 05/30/2016  . Screening for prostate cancer 05/30/2016  . Type 2 diabetes mellitus without complication, without long-term current use of insulin (HCC) 02/10/2015   Overview:  2016: 6.5 2017: 412 random with A1C 7.7, started MTF  . Wellness examination 05/30/2016    Past Surgical History:  Procedure Laterality Date  . CARPAL TUNNEL RELEASE    . CORONARY ANGIOPLASTY WITH STENT PLACEMENT    . KNEE SURGERY    . TRIGGER FINGER RELEASE      Current Medications: Current Meds  Medication Sig  . allopurinol (ZYLOPRIM) 300 MG tablet Take 300 mg by mouth daily.  Marland Kitchen aspirin EC 81 MG tablet Take 81 mg by mouth daily.   Marland Kitchen atorvastatin (LIPITOR) 80 MG tablet Take 1 tablet (80 mg total) by mouth daily.  . clopidogrel (PLAVIX) 75  MG tablet Take 75 mg by mouth daily.  . Colchicine 0.6 MG CAPS Take 0.6 mg by mouth daily as needed.  . ezetimibe (ZETIA) 10 MG tablet Take 10 mg by mouth daily.  Marland Kitchen FARXIGA 10 MG TABS tablet Take 10 mg by mouth every morning.  . fluorouracil (EFUDEX) 5 % cream Apply 1 application topically daily.  . Ivermectin 1 % CREA Apply 1 application topically daily.  . metFORMIN (GLUCOPHAGE-XR) 500 MG 24 hr tablet Take 1,000 mg by mouth 2 (two) times daily.   . metoprolol  tartrate (LOPRESSOR) 25 MG tablet TAKE 1 TABLET BY MOUTH TWICE A DAY  . nitroGLYCERIN (NITROSTAT) 0.4 MG SL tablet PLACE 1 TABLET (0.4 MG TOTAL) UNDER THE TONGUE EVERY FIVE (5) MINUTES AS NEEDED FOR CHEST PAIN.  Marland Kitchen potassium chloride (KLOR-CON) 10 MEQ tablet TAKE 1 TABLET BY MOUTH EVERY DAY  . Semaglutide 7 MG TABS Take 7 mg by mouth daily.  . tadalafil (CIALIS) 10 MG tablet Take 10 mg by mouth daily as needed for erectile dysfunction.  . tamsulosin (FLOMAX) 0.4 MG CAPS capsule Take 0.4 mg by mouth 2 (two) times daily.  Marland Kitchen torsemide (DEMADEX) 20 MG tablet TAKE 1 TABLET (20 MG TOTAL) BY MOUTH 2 (TWO) TIMES DAILY. INSUR WILL PAY ON 02/21/19  . triamcinolone cream (KENALOG) 0.1 % as needed.  . valACYclovir (VALTREX) 500 MG tablet TAKE 4 TABLETS BY MOUTH EVERY 12 HOURS FOR 1 DAY (START AFTER SYMPTOMS)     Allergies:   Morphine and Pseudoephedrine hcl   Social History   Socioeconomic History  . Marital status: Significant Other    Spouse name: Not on file  . Number of children: Not on file  . Years of education: Not on file  . Highest education level: Not on file  Occupational History  . Not on file  Tobacco Use  . Smoking status: Former Games developer  . Smokeless tobacco: Never Used  Vaping Use  . Vaping Use: Never used  Substance and Sexual Activity  . Alcohol use: No  . Drug use: No  . Sexual activity: Not on file  Other Topics Concern  . Not on file  Social History Narrative  . Not on file   Social Determinants of Health   Financial Resource Strain: Not on file  Food Insecurity: Not on file  Transportation Needs: Not on file  Physical Activity: Not on file  Stress: Not on file  Social Connections: Not on file     Family History: The patient's family history includes Cancer in his father; Diabetes in his brother and father; Heart attack in his brother; Heart failure in his father and mother; Hypertension in his mother; Thyroid disease in his sister. ROS:   Please see the history  of present illness.    All other systems reviewed and are negative.  EKGs/Labs/Other Studies Reviewed:    The following studies were reviewed today:  EKG:  EKG ordered today and personally reviewed.  The ekg ordered today demonstrates sinus rhythm normal EKG  Recent Labs: 07/22/2019: BUN 23; Creatinine, Ser 1.28; Hemoglobin 17.0; Platelets 197; Potassium 4.1; Sodium 141  Recent Lipid Panel    Component Value Date/Time   CHOL 116 06/26/2019 0918   TRIG 114 06/26/2019 0918   HDL 40 06/26/2019 0918   CHOLHDL 2.9 06/26/2019 0918   LDLCALC 55 06/26/2019 0918    Physical Exam:    VS:  BP 124/78   Pulse 63   Ht 5' 8.5" (1.74 m)   Wt  285 lb 9.6 oz (129.5 kg)   SpO2 98%   BMI 42.79 kg/m     Wt Readings from Last 3 Encounters:  03/09/20 285 lb 9.6 oz (129.5 kg)  02/02/20 280 lb (127 kg)  08/03/19 (!) 283 lb (128.4 kg)     GEN: Obese BMI 43 well nourished, well developed in no acute distress HEENT: Normal NECK: No JVD; No carotid bruits LYMPHATICS: No lymphadenopathy CARDIAC: RRR, no murmurs, rubs, gallops RESPIRATORY:  Clear to auscultation without rales, wheezing or rhonchi  ABDOMEN: Soft, non-tender, non-distended MUSCULOSKELETAL:  No edema; No deformity  SKIN: Warm and dry NEUROLOGIC:  Alert and oriented x 3 PSYCHIATRIC:  Normal affect    Signed, Norman Herrlich, MD  03/09/2020 3:53 PM    [facility]

## 2020-04-22 ENCOUNTER — Other Ambulatory Visit: Payer: Self-pay | Admitting: Cardiology

## 2020-04-22 NOTE — Telephone Encounter (Signed)
Refill sent to pharmacy.   

## 2020-07-03 ENCOUNTER — Other Ambulatory Visit: Payer: Self-pay | Admitting: Cardiology

## 2020-07-05 DIAGNOSIS — R197 Diarrhea, unspecified: Secondary | ICD-10-CM

## 2020-07-05 HISTORY — DX: Diarrhea, unspecified: R19.7

## 2020-07-06 NOTE — Telephone Encounter (Signed)
Potassium CL ER 10 meq tablets # 90 x 3 refills sent to [id number] - [place], [place] - [address]

## 2020-07-13 ENCOUNTER — Other Ambulatory Visit: Payer: Self-pay | Admitting: Cardiology

## 2020-08-19 ENCOUNTER — Other Ambulatory Visit: Payer: Self-pay

## 2020-08-19 MED ORDER — NITROGLYCERIN 0.4 MG SL SUBL: SUBLINGUAL_TABLET | SUBLINGUAL | 3 refills | Status: DC

## 2020-08-23 DIAGNOSIS — R198 Other specified symptoms and signs involving the digestive system and abdomen: Secondary | ICD-10-CM | POA: Insufficient documentation

## 2020-08-23 HISTORY — DX: Other specified symptoms and signs involving the digestive system and abdomen: R19.8

## 2020-09-19 DIAGNOSIS — B351 Tinea unguium: Secondary | ICD-10-CM | POA: Insufficient documentation

## 2020-09-19 HISTORY — DX: Tinea unguium: B35.1

## 2020-09-30 ENCOUNTER — Other Ambulatory Visit: Payer: Self-pay | Admitting: Cardiology

## 2020-11-01 ENCOUNTER — Other Ambulatory Visit: Payer: Self-pay

## 2020-11-02 ENCOUNTER — Other Ambulatory Visit: Payer: Self-pay

## 2020-11-02 ENCOUNTER — Ambulatory Visit: Payer: BC Managed Care – PPO | Admitting: Cardiology

## 2020-11-02 ENCOUNTER — Encounter: Payer: Self-pay | Admitting: Cardiology

## 2020-11-02 VITALS — BP 102/64 | HR 66 | Ht 69.0 in | Wt 278.8 lb

## 2020-11-02 DIAGNOSIS — I110 Hypertensive heart disease with heart failure: Secondary | ICD-10-CM | POA: Diagnosis not present

## 2020-11-02 DIAGNOSIS — E785 Hyperlipidemia, unspecified: Secondary | ICD-10-CM | POA: Diagnosis not present

## 2020-11-02 DIAGNOSIS — I5032 Chronic diastolic (congestive) heart failure: Secondary | ICD-10-CM

## 2020-11-02 DIAGNOSIS — I25118 Atherosclerotic heart disease of native coronary artery with other forms of angina pectoris: Secondary | ICD-10-CM

## 2020-11-02 NOTE — Patient Instructions (Signed)

## 2020-11-02 NOTE — Progress Notes (Signed)
Cardiology Office Note:    Date:  11/02/2020   ID:  Kirk Frazier, DOB Nov 26, 1955, MRN [id number]  PCP:  Olive Bass, MD  Cardiologist:  Norman Herrlich, MD    Referring MD: Olive Bass, MD    ASSESSMENT:    1. Hypertensive heart disease with chronic diastolic congestive heart failure (HCC)   2. Coronary artery disease with stable angina pectoris, unspecified vessel or lesion type, unspecified whether native or transplanted heart (HCC)   3. Hyperlipidemia, unspecified hyperlipidemia type    PLAN:    In order of problems listed above:  Stable I cautioned him that if he notices shortness of breath or edema to increase his diuretic.  Continue his antihypertensives including metoprolol. Stable CAD having no anginal discomfort we will continue his dual antiplatelet therapy beta-blocker and high intensity statin lipids at target Continue statin with CAD Follow-up with his primary care physician about his back pain Stable diabetes managed by his PCP   Next appointment: 6 months   Medication Adjustments/Labs and Tests Ordered: Current medicines are reviewed at length with the patient today.  Concerns regarding medicines are outlined above.  No orders of the defined types were placed in this encounter.  No orders of the defined types were placed in this encounter.   Chief Complaint  Patient presents with   Follow-up   Congestive Heart Failure    History of Present Illness:    Kirk Frazier is a 65 y.o. male with a hx of hypertensive heart disease with chronic diastolic heart failure hyperlipidemia and coronary artery disease with PCI and stent 2016 LAD last seen 03/09/2020.  Compliance with diet, lifestyle and medications: Yes  Most recent labs 09/19/2020: Cholesterol 139 triglycerides 132 HDL 49 non-HDL cholesterol 90 LDL cholesterol 66  Unfortunately is having right flank pain was thought to have a renal calculus is been imaged does not have 1 the symptoms  sound like it spine disease it hurts when he coughs and sneezes and has radiation down in the right buttock. He reduced his diuretic and he is not having edema shortness of breath chest pain palpitation or syncope Past Medical History:  Diagnosis Date   Benign hypertension 02/10/2015   CAD in native artery 07/07/2014   Overview:  PCI and DES to LCFx 01/08/14 for Troponin  Normal unstable angina   Chronic diastolic heart failure (HCC) 07/07/2014   Coronary artery disease 02/10/2015   Overview:  Managed CARDS 2016: CIRC 95% PCI Resolute stent LAD 40% EF 65%   Essential hypertension 07/07/2014   Gout 02/10/2015   Overview:  2017: sUA=9.6   Hemoglobinemia due to blood transfusion 05/31/2016   Overview:  1982   Hyperlipidemia 07/07/2014   Nephrolithiasis 02/10/2015   Post-traumatic osteoarthritis of both knees 03/22/2016   Rectal bleeding 02/28/2015   Rib pain on right side 05/31/2016   Overview:  2014: injury   Screening for colon cancer 05/30/2016   Screening for prostate cancer 05/30/2016   Type 2 diabetes mellitus without complication, without long-term current use of insulin (HCC) 02/10/2015   Overview:  2016: 6.5 2017: 412 random with A1C 7.7, started MTF   Wellness examination 05/30/2016    Past Surgical History:  Procedure Laterality Date   CARPAL TUNNEL RELEASE     CORONARY ANGIOPLASTY WITH STENT PLACEMENT     KNEE SURGERY     TRIGGER FINGER RELEASE      Current Medications: Current Meds  Medication Sig   allopurinol (ZYLOPRIM) 300 MG tablet Take 300  mg by mouth daily.   aspirin EC 81 MG tablet Take 81 mg by mouth daily.    atorvastatin (LIPITOR) 80 MG tablet TAKE 1 TABLET BY MOUTH EVERY DAY   clopidogrel (PLAVIX) 75 MG tablet Take 75 mg by mouth daily.   Colchicine 0.6 MG CAPS Take 0.6 mg by mouth daily as needed for other. gout   ezetimibe (ZETIA) 10 MG tablet Take 10 mg by mouth daily.   FARXIGA 10 MG TABS tablet Take 10 mg by mouth every morning.   fluorouracil (EFUDEX) 5 % cream Apply 1  application topically daily.   Ivermectin 1 % CREA Apply 1 application topically daily.   metFORMIN (GLUCOPHAGE-XR) 500 MG 24 hr tablet Take 1,000 mg by mouth 2 (two) times daily.    metoprolol tartrate (LOPRESSOR) 25 MG tablet TAKE 1 TABLET BY MOUTH TWICE A DAY   nitroGLYCERIN (NITROSTAT) 0.4 MG SL tablet PLACE 1 TABLET (0.4 MG TOTAL) UNDER THE TONGUE EVERY FIVE (5) MINUTES AS NEEDED FOR CHEST PAIN.   potassium chloride (KLOR-CON) 10 MEQ tablet TAKE 1 TABLET BY MOUTH EVERY DAY   RYBELSUS 7 MG TABS Take 1 tablet by mouth daily.   tadalafil (CIALIS) 10 MG tablet Take 10 mg by mouth daily as needed for erectile dysfunction.   tamsulosin (FLOMAX) 0.4 MG CAPS capsule Take 0.4 mg by mouth 2 (two) times daily.   terbinafine (LAMISIL) 250 MG tablet Take 250 mg by mouth daily.   torsemide (DEMADEX) 20 MG tablet TAKE 1 TABLET (20 MG TOTAL) BY MOUTH 2 (TWO) TIMES DAILY. INSUR WILL PAY ON 02/21/19   valACYclovir (VALTREX) 500 MG tablet TAKE 4 TABLETS BY MOUTH EVERY 12 HOURS FOR 1 DAY (START AFTER SYMPTOMS)     Allergies:   Morphine and Pseudoephedrine hcl   Social History   Socioeconomic History   Marital status: Significant Other    Spouse name: Not on file   Number of children: Not on file   Years of education: Not on file   Highest education level: Not on file  Occupational History   Not on file  Tobacco Use   Smoking status: Former   Smokeless tobacco: Never  Vaping Use   Vaping Use: Never used  Substance and Sexual Activity   Alcohol use: No   Drug use: No   Sexual activity: Not on file  Other Topics Concern   Not on file  Social History Narrative   Not on file   Social Determinants of Health   Financial Resource Strain: Not on file  Food Insecurity: Not on file  Transportation Needs: Not on file  Physical Activity: Not on file  Stress: Not on file  Social Connections: Not on file     Family History: The patient's family history includes Cancer in his father; Diabetes in  his brother and father; Heart attack in his brother; Heart failure in his father and mother; Hypertension in his mother; Thyroid disease in his sister. ROS:   Please see the history of present illness.    All other systems reviewed and are negative.  EKGs/Labs/Other Studies Reviewed:    The following studies were reviewed today: See history for labs   Physical Exam:    VS:  BP 102/64   Pulse 66   Ht 5\' 9"  (1.753 m)   Wt 278 lb 12.8 oz (126.5 kg)   SpO2 96%   BMI 41.17 kg/m     Wt Readings from Last 3 Encounters:  11/02/20 278 lb 12.8 oz (  126.5 kg)  03/09/20 285 lb 9.6 oz (129.5 kg)  02/02/20 280 lb (127 kg)     GEN:  Well nourished, well developed in no acute distress obese BMI exceeds 40 HEENT: Normal NECK: No JVD; No carotid bruits LYMPHATICS: No lymphadenopathy CARDIAC: RRR, no murmurs, rubs, gallops RESPIRATORY:  Clear to auscultation without rales, wheezing or rhonchi  ABDOMEN: Soft, non-tender, non-distended MUSCULOSKELETAL:  No edema; No deformity  SKIN: Warm and dry NEUROLOGIC:  Alert and oriented x 3 PSYCHIATRIC:  Normal affect    Signed, Norman Herrlich, MD  11/02/2020 2:58 PM    [facility]

## 2020-12-11 DIAGNOSIS — I6389 Other cerebral infarction: Secondary | ICD-10-CM | POA: Diagnosis not present

## 2020-12-13 ENCOUNTER — Telehealth: Payer: Self-pay | Admitting: Cardiology

## 2020-12-13 DIAGNOSIS — I639 Cerebral infarction, unspecified: Secondary | ICD-10-CM

## 2020-12-13 NOTE — Progress Notes (Signed)
Cardiology Office Note:    Date:  12/14/2020   ID:  Kirk Frazier, DOB [birth year], MRN [id number]  PCP:  Olive Bass, MD  Cardiologist:  Norman Herrlich, MD    Referring MD: Olive Bass, MD    ASSESSMENT:    1. Cerebrovascular accident (CVA), unspecified mechanism (HCC)   2. 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 Coronary artery disease with stable angina pectoris, unspecified vessel or lesion type, unspecified whether native or transplanted heart (HCC)   4. 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  Concerns regarding medicines are outlined above.  Orders Placed This Encounter  Procedures   Ambulatory referral to Neurology   No orders of the defined types were placed in this encounter.  Chief complaint: I had another stroke History of Present Illness:    Kirk Frazier is a 65 y.o. male with a hx of CAD with PCI and stent to LAD 2016 hypertensive heart disease and chronic diastolic heart failure previous stroke and hyper lipidemia last seen 11/02/2020.  Office received communication that he is at [facility] and recurrent stroke and advised to have an  event monitor.  He was recently admitted to [facility] 12/12/2020 with a  discharge diagnosis of history of stroke strokelike symptoms hypertension he was seen by teleneurology cardiology was not consulted.  His symptoms included a visual cut in the right eye left upper extremity numbness.  MRI is described as showing multiple small infarcts within the right frontal parietal region they did an echocardiogram at the hospital that was described as showing no cardiac source of stroke and he was discharged from the hospital on aspirin clopidogrel with a recommendation to wear a 30-day ambulatory monitor.  Is also advised to get an MRI and follow-up 4 to 6 weeks and was referred to ophthalmology.  Laboratory studies showed a hemoglobin of 14.9 creatinine 1.2 LDL cholesterol 54 has not specifically described that he was placed on telemetry I suspect he was his EKG showed sinus rhythm 59 bpm first-degree AV block.  His echocardiogram is described as technically difficult with suboptimal views he did not have a shunt with agitated contrast injection.  Compliance with diet, lifestyle and medications: Yes Aspirin and clopidogrel dual antiplatelet therapy  He is very frustrated with recurrent stroke.  He did have a TEE done at [facility] health summer 2021.  He has no documented atrial fibrillation. We will apply 30-day monitor in the office referred to EP for consideration of a loop recorder I think we need to repeat the TEE with questions regarding the quality of echocardiograms at the outside hospital.  I also asked him to purchase an apple watch to detect atrial fibrillation.  At his request I will refer him to neurology [facility] neurology and he has an appointment to see an ophthalmologist.  He does not  feel well he feels fatigued and still has a visual cut.  No palpitation edema shortness of breath or chest pain °Past Medical History:  °Diagnosis Date  ° Benign hypertension 02/10/2015  ° CAD in native artery 07/07/2014  ° Overview:  PCI and DES to LCFx 01/08/14 for Troponin  Normal unstable  angina  ° Chronic diastolic heart failure (HCC) 07/07/2014  ° Coronary artery disease 02/10/2015  ° Overview:  Managed CARDS 2016: CIRC 95% PCI Resolute stent LAD 40% EF 65%  ° Essential hypertension 07/07/2014  ° Gout 02/10/2015  ° Overview:  2017: sUA=9.6  ° Hemoglobinemia due to blood transfusion 05/31/2016  ° Overview:  1982  ° Hyperlipidemia 07/07/2014  ° Nephrolithiasis 02/10/2015  ° Post-traumatic osteoarthritis of both knees 03/22/2016  ° Rectal bleeding 02/28/2015  ° Rib pain on right side 05/31/2016  ° Overview:  2014: injury  ° Screening for colon cancer 05/30/2016  ° Screening for prostate cancer 05/30/2016  ° Type 2 diabetes mellitus without complication, without long-term current use of insulin (HCC) 02/10/2015  ° Overview:  2016: 6.5 2017: 412 random with A1C 7.7, started MTF  ° Wellness examination 05/30/2016  ° ° °Past Surgical History:  °Procedure Laterality Date  ° CARPAL TUNNEL RELEASE    ° CORONARY ANGIOPLASTY WITH STENT PLACEMENT    ° KNEE SURGERY    ° TRIGGER FINGER RELEASE    ° ° °Current Medications: °Current Meds  °Medication Sig  ° allopurinol (ZYLOPRIM) 300 MG tablet Take 300 mg by mouth daily.  ° aspirin EC 81 MG tablet Take 81 mg by mouth daily.   ° atorvastatin (LIPITOR) 80 MG tablet TAKE 1 TABLET BY MOUTH EVERY DAY  ° clopidogrel (PLAVIX) 75 MG tablet Take 75 mg by mouth daily.  ° Colchicine 0.6 MG CAPS Take 0.6 mg by mouth daily as needed for other. gout  ° ezetimibe (ZETIA) 10 MG tablet Take 10 mg by mouth daily.  ° FARXIGA 10 MG TABS tablet Take 10 mg by mouth every morning.  ° fluorouracil (EFUDEX) 5 % cream Apply 1 application topically daily.  ° Ivermectin 1 % CREA Apply 1 application topically daily.  ° metFORMIN (GLUCOPHAGE-XR) 500 MG 24 hr tablet Take 1,000 mg by mouth 2 (two) times daily.   ° metoprolol tartrate (LOPRESSOR) 25 MG tablet TAKE 1 TABLET BY MOUTH TWICE A DAY  ° nitroGLYCERIN (NITROSTAT) 0.4 MG SL tablet PLACE 1 TABLET (0.4 MG TOTAL) UNDER THE TONGUE EVERY FIVE (5) MINUTES AS NEEDED  FOR CHEST PAIN.  ° potassium chloride (KLOR-CON) 10 MEQ tablet TAKE 1 TABLET BY MOUTH EVERY DAY  ° RYBELSUS 7 MG TABS Take 1 tablet by mouth daily.  ° tadalafil (CIALIS) 10 MG tablet Take 10 mg by mouth daily as needed for erectile dysfunction.  ° tamsulosin (FLOMAX) 0.4 MG CAPS capsule Take 0.4 mg by mouth 2 (two) times daily.  ° terbinafine (LAMISIL) 250 MG tablet Take 250 mg by mouth daily.  ° torsemide (DEMADEX) 20 MG tablet TAKE 1 TABLET (20 MG TOTAL) BY MOUTH 2 (TWO) TIMES DAILY. INSUR WILL PAY ON 02/21/19  ° valACYclovir (VALTREX) 500 MG tablet TAKE 4 TABLETS BY MOUTH EVERY 12 HOURS FOR 1 DAY (START AFTER SYMPTOMS)  °  ° °Allergies:   Morphine and Pseudoephedrine hcl  ° °Social History  ° °Socioeconomic History  ° Marital status: Significant Other  °  Spouse name: Not on file  ° Number of children: Not on file  ° Years of education: Not on file  °   Highest education level: Not on file  °Occupational History  ° Not on file  °Tobacco Use  ° Smoking status: Former  °  Passive exposure: Past  ° Smokeless tobacco: Never  °Vaping Use  ° Vaping Use: Never used  °Substance and Sexual Activity  ° Alcohol use: No  ° Drug use: No  ° Sexual activity: Not on file  °Other Topics Concern  ° Not on file  °Social History Narrative  ° Not on file  ° °Social Determinants of Health  ° °Financial Resource Strain: Not on file  °Food Insecurity: Not on file  °Transportation Needs: Not on file  °Physical Activity: Not on file  °Stress: Not on file  °Social Connections: Not on file  °  ° °Family History: °The patient's family history includes Cancer in his father; Diabetes in his brother and father; Heart attack in his brother; Heart failure in his father and mother; Hypertension in his mother; Thyroid disease in his sister. °ROS:   °Please see the history of present illness.    °All other systems reviewed and are negative. ° °EKGs/Labs/Other Studies Reviewed:   ° °The following studies were reviewed today: ° °Recent Labs: °No results  found for requested labs within last 8760 hours.  °Recent Lipid Panel °   °Component Value Date/Time  ° CHOL 116 06/26/2019 0918  ° TRIG 114 06/26/2019 0918  ° HDL 40 06/26/2019 0918  ° CHOLHDL 2.9 06/26/2019 0918  ° LDLCALC 55 06/26/2019 0918  ° ° °Physical Exam:   ° °VS:  BP 110/71    Pulse 64    Ht 5' 9" (1.753 m)    Wt 280 lb (127 kg)    SpO2 97%    BMI 41.35 kg/m²    ° °Wt Readings from Last 3 Encounters:  °12/14/20 280 lb (127 kg)  °11/02/20 278 lb 12.8 oz (126.5 kg)  °03/09/20 285 lb 9.6 oz (129.5 kg)  °  ° °GEN: Obese well nourished, well developed in no acute distress °HEENT: Normal °NECK: No JVD; No carotid bruits °LYMPHATICS: No lymphadenopathy °CARDIAC: RRR, no murmurs, rubs, gallops °RESPIRATORY:  Clear to auscultation without rales, wheezing or rhonchi  °ABDOMEN: Soft, non-tender, non-distended °MUSCULOSKELETAL:  No edema; No deformity  °SKIN: Warm and dry °NEUROLOGIC:  Alert and oriented x 3 °PSYCHIATRIC:  Normal affect  ° ° °Signed, °Kenslei Hearty, MD  °12/14/2020 1:37 PM    ° [facility]  ° °

## 2020-12-13 NOTE — Telephone Encounter (Signed)
Patient informed of plan. EP referral placed. Patient will have monitor placed tomorrow when he comes for appointment with Dr. Dulce Sellar. Will let his RN know.

## 2020-12-13 NOTE — Telephone Encounter (Signed)
Patient states the hospital told him to see his cardiologist about getting a heart monitor. He has a appointment tomorrow with Dr. Dulce Sellar tomorrow. Will send a note.

## 2020-12-13 NOTE — Addendum Note (Signed)
Addended by: Hazle Quant on: 12/13/2020 03:39 PM   Modules accepted: Orders

## 2020-12-13 NOTE — Telephone Encounter (Signed)
Patient states he had a stroke on 12/11 and he went to the hospital. He was discharged yesterday, 12/12, and they advised him to follow up with cardiology. I scheduled him for 12/14 with Dr. Dulce Sellar. He states they also ordered a heart monitor and he would like to know if the order has been received. Please advise.

## 2020-12-13 NOTE — H&P (View-Only) (Signed)
Cardiology Office Note:    Date:  12/14/2020   ID:  Kirk Frazier, DOB [birth year], MRN [id number]  PCP:  Olive Bass, MD  Cardiologist:  Norman Herrlich, MD    Referring MD: Olive Bass, MD    ASSESSMENT:    1. Cerebrovascular accident (CVA), unspecified mechanism (HCC)   2. 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 Coronary artery disease with stable angina pectoris, unspecified vessel or lesion type, unspecified whether native or transplanted heart (HCC)   4. 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  Concerns regarding medicines are outlined above.  Orders Placed This Encounter  Procedures   Ambulatory referral to Neurology   No orders of the defined types were placed in this encounter.  Chief complaint: I had another stroke History of Present Illness:    Kirk Frazier is a 65 y.o. male with a hx of CAD with PCI and stent to LAD 2016 hypertensive heart disease and chronic diastolic heart failure previous stroke and hyper lipidemia last seen 11/02/2020.  Office received communication that he is at [facility] and recurrent stroke and advised to have an  event monitor.  He was recently admitted to [facility] 12/12/2020 with a  discharge diagnosis of history of stroke strokelike symptoms hypertension he was seen by teleneurology cardiology was not consulted.  His symptoms included a visual cut in the right eye left upper extremity numbness.  MRI is described as showing multiple small infarcts within the right frontal parietal region they did an echocardiogram at the hospital that was described as showing no cardiac source of stroke and he was discharged from the hospital on aspirin clopidogrel with a recommendation to wear a 30-day ambulatory monitor.  Is also advised to get an MRI and follow-up 4 to 6 weeks and was referred to ophthalmology.  Laboratory studies showed a hemoglobin of 14.9 creatinine 1.2 LDL cholesterol 54 has not specifically described that he was placed on telemetry I suspect he was his EKG showed sinus rhythm 59 bpm first-degree AV block.  His echocardiogram is described as technically difficult with suboptimal views he did not have a shunt with agitated contrast injection.  Compliance with diet, lifestyle and medications: Yes Aspirin and clopidogrel dual antiplatelet therapy  He is very frustrated with recurrent stroke.  He did have a TEE done at [facility] health summer 2021.  He has no documented atrial fibrillation. We will apply 30-day monitor in the office referred to EP for consideration of a loop recorder I think we need to repeat the TEE with questions regarding the quality of echocardiograms at the outside hospital.  I also asked him to purchase an apple watch to detect atrial fibrillation.  At his request I will refer him to neurology [facility] neurology and he has an appointment to see an ophthalmologist.  He does not  feel well he feels fatigued and still has a visual cut.  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 05/31/2016   Overview:  1982   Hyperlipidemia 07/07/2014   Nephrolithiasis 02/10/2015   Post-traumatic osteoarthritis of both knees 03/22/2016   Rectal bleeding 02/28/2015   Rib pain on right side 05/31/2016   Overview:  2014: injury   Screening for colon cancer 05/30/2016   Screening for prostate cancer 05/30/2016   Type 2 diabetes mellitus without complication, without long-term current use of insulin (HCC) 02/10/2015   Overview:  2016: 6.5 2017: 412 random with A1C 7.7, started MTF   Wellness examination 05/30/2016    Past Surgical History:  Procedure Laterality Date   CARPAL TUNNEL RELEASE     CORONARY ANGIOPLASTY WITH STENT PLACEMENT     KNEE SURGERY     TRIGGER FINGER RELEASE      Current Medications: Current Meds  Medication Sig   allopurinol (ZYLOPRIM) 300 MG tablet Take 300 mg by mouth daily.   aspirin EC 81 MG tablet Take 81 mg by mouth daily.    atorvastatin (LIPITOR) 80 MG tablet TAKE 1 TABLET BY MOUTH EVERY DAY   clopidogrel (PLAVIX) 75 MG tablet Take 75 mg by mouth daily.   Colchicine 0.6 MG CAPS Take 0.6 mg by mouth daily as needed for other. gout   ezetimibe (ZETIA) 10 MG tablet Take 10 mg by mouth daily.   FARXIGA 10 MG TABS tablet Take 10 mg by mouth every morning.   fluorouracil (EFUDEX) 5 % cream Apply 1 application topically daily.   Ivermectin 1 % CREA Apply 1 application topically daily.   metFORMIN (GLUCOPHAGE-XR) 500 MG 24 hr tablet Take 1,000 mg by mouth 2 (two) times daily.    metoprolol tartrate (LOPRESSOR) 25 MG tablet TAKE 1 TABLET BY MOUTH TWICE A DAY   nitroGLYCERIN (NITROSTAT) 0.4 MG SL tablet PLACE 1 TABLET (0.4 MG TOTAL) UNDER THE TONGUE EVERY FIVE (5) MINUTES AS NEEDED  FOR CHEST PAIN.   potassium chloride (KLOR-CON) 10 MEQ tablet TAKE 1 TABLET BY MOUTH EVERY DAY   RYBELSUS 7 MG TABS Take 1 tablet by mouth daily.   tadalafil (CIALIS) 10 MG tablet Take 10 mg by mouth daily as needed for erectile dysfunction.   tamsulosin (FLOMAX) 0.4 MG CAPS capsule Take 0.4 mg by mouth 2 (two) times daily.   terbinafine (LAMISIL) 250 MG tablet Take 250 mg by mouth daily.   torsemide (DEMADEX) 20 MG tablet TAKE 1 TABLET (20 MG TOTAL) BY MOUTH 2 (TWO) TIMES DAILY. INSUR WILL PAY ON 02/21/19   valACYclovir (VALTREX) 500 MG tablet TAKE 4 TABLETS BY MOUTH EVERY 12 HOURS FOR 1 DAY (START AFTER SYMPTOMS)     Allergies:   Morphine and Pseudoephedrine hcl   Social History   Socioeconomic History   Marital status: Significant Other    Spouse name: Not on file   Number of children: Not on file   Years of education: Not on file  Highest education level: Not on file  Occupational History   Not on file  Tobacco Use   Smoking status: Former    Passive exposure: Past   Smokeless tobacco: Never  Vaping Use   Vaping Use: Never used  Substance and Sexual Activity   Alcohol use: No   Drug use: No   Sexual activity: Not on file  Other Topics Concern   Not on file  Social History Narrative   Not on file   Social Determinants of Health   Financial Resource Strain: Not on file  Food Insecurity: Not on file  Transportation Needs: Not on file  Physical Activity: Not on file  Stress: Not on file  Social Connections: Not on file     Family History: The patient's family history includes Cancer in his father; Diabetes in his brother and father; Heart attack in his brother; Heart failure in his father and mother; Hypertension in his mother; Thyroid disease in his sister. ROS:   Please see the history of present illness.    All other systems reviewed and are negative.  EKGs/Labs/Other Studies Reviewed:    The following studies were reviewed today:  Recent Labs: No results  found for requested labs within last 8760 hours.  Recent Lipid Panel    Component Value Date/Time   CHOL 116 06/26/2019 0918   TRIG 114 06/26/2019 0918   HDL 40 06/26/2019 0918   CHOLHDL 2.9 06/26/2019 0918   LDLCALC 55 06/26/2019 0918    Physical Exam:    VS:  BP 110/71    Pulse 64    Ht 5\' 9"  (1.753 m)    Wt 280 lb (127 kg)    SpO2 97%    BMI 41.35 kg/m     Wt Readings from Last 3 Encounters:  12/14/20 280 lb (127 kg)  11/02/20 278 lb 12.8 oz (126.5 kg)  03/09/20 285 lb 9.6 oz (129.5 kg)     GEN: Obese well nourished, well developed in no acute distress HEENT: Normal NECK: No JVD; No carotid bruits LYMPHATICS: No lymphadenopathy CARDIAC: RRR, no murmurs, rubs, gallops RESPIRATORY:  Clear to auscultation without rales, wheezing or rhonchi  ABDOMEN: Soft, non-tender, non-distended MUSCULOSKELETAL:  No edema; No deformity  SKIN: Warm and dry NEUROLOGIC:  Alert and oriented x 3 PSYCHIATRIC:  Normal affect    Signed, 05/09/20, MD  12/14/2020 1:37 PM    [facility]

## 2020-12-14 ENCOUNTER — Encounter: Payer: Self-pay | Admitting: Cardiology

## 2020-12-14 ENCOUNTER — Ambulatory Visit: Payer: Managed Care, Other (non HMO) | Admitting: Cardiology

## 2020-12-14 ENCOUNTER — Other Ambulatory Visit: Payer: Self-pay

## 2020-12-14 ENCOUNTER — Ambulatory Visit: Payer: Managed Care, Other (non HMO)

## 2020-12-14 VITALS — BP 110/71 | HR 64 | Ht 69.0 in | Wt 280.0 lb

## 2020-12-14 DIAGNOSIS — I110 Hypertensive heart disease with heart failure: Secondary | ICD-10-CM | POA: Diagnosis not present

## 2020-12-14 DIAGNOSIS — I639 Cerebral infarction, unspecified: Secondary | ICD-10-CM | POA: Diagnosis not present

## 2020-12-14 DIAGNOSIS — E785 Hyperlipidemia, unspecified: Secondary | ICD-10-CM

## 2020-12-14 DIAGNOSIS — I25118 Atherosclerotic heart disease of native coronary artery with other forms of angina pectoris: Secondary | ICD-10-CM | POA: Diagnosis not present

## 2020-12-14 DIAGNOSIS — I5032 Chronic diastolic (congestive) heart failure: Secondary | ICD-10-CM

## 2020-12-14 DIAGNOSIS — I480 Paroxysmal atrial fibrillation: Secondary | ICD-10-CM

## 2020-12-14 NOTE — Patient Instructions (Addendum)
Medication Instructions:  Your physician recommends that you continue on your current medications as directed. Please refer to the Current Medication list given to you today.  *If you need a refill on your cardiac medications before your next appointment, please call your pharmacy*   Lab Work: Your physician recommends that you return for lab work on: 12/27/20-12/30/21 If you have labs (blood work) drawn today and your tests are completely normal, you will receive your results only by: MyChart Message (if you have MyChart) OR A paper copy in the mail If you have any lab test that is abnormal or we need to change your treatment, we will call you to review the results.   Testing/Procedures: You are scheduled for a TEE on 01/03/2021 with Dr. Elease Hashimoto.  Please arrive at the [facility] (Main Entrance A) at [facility]: [address] [place], [place] [address] at 7 am. (1 hour prior to procedure).   DIET: Nothing to eat or drink after midnight except a sip of water with medications (see medication instructions below)  FYI: For your safety, and to allow [place] to monitor your vital signs accurately during the surgery/procedure we request that   if you have artificial nails, gel coating, SNS etc. Please have those removed prior to your surgery/procedure. Not having the nail coverings /polish removed may result in cancellation or delay of your surgery/procedure.   Medication Instructions:  You can take all of your medications prior to your procedure.    You must have a responsible person to drive you home and stay in the waiting area during your procedure. Failure to do so could result in cancellation.  Bring your insurance cards.  *Special Note: Every effort is made to have your procedure done on time. Occasionally there are emergencies that occur at the hospital that may cause delays. Please be patient if a delay does occur.     Follow-Up: At [facility], you and your health  needs are our priority.  As part of our continuing mission to provide you with exceptional heart care, we have created designated Provider Care Teams.  These Care Teams include your primary Cardiologist (physician) and Advanced Practice Providers (APPs -  Physician Assistants and Nurse Practitioners) who all work together to provide you with the care you need, when you need it.  We recommend signing up for the patient portal called "MyChart".  Sign up information is provided on this After Visit Summary.  MyChart is used to connect with patients for Virtual Visits (Telemedicine).  Patients are able to view lab/test results, encounter notes, upcoming appointments, etc.  Non-urgent messages can be sent to your provider as well.   To learn more about what you can do with MyChart, go to [email or web address].    Your next appointment:   2 month(s)  The format for your next appointment:   In Person  Provider:   Norman Herrlich, MD    Other Instructions

## 2020-12-16 DIAGNOSIS — J189 Pneumonia, unspecified organism: Secondary | ICD-10-CM | POA: Insufficient documentation

## 2020-12-16 HISTORY — DX: Pneumonia, unspecified organism: J18.9

## 2020-12-19 ENCOUNTER — Encounter: Payer: Self-pay | Admitting: Cardiology

## 2020-12-20 ENCOUNTER — Encounter (HOSPITAL_COMMUNITY): Payer: Self-pay | Admitting: Cardiovascular Disease

## 2020-12-20 NOTE — Progress Notes (Signed)
Attempted to obtain medical history via telephone, unable to reach at this time. I left a voicemail to return pre surgical testing department's phone call.  

## 2020-12-23 ENCOUNTER — Other Ambulatory Visit: Payer: Self-pay | Admitting: Cardiology

## 2020-12-23 ENCOUNTER — Telehealth: Payer: Self-pay

## 2020-12-23 NOTE — Telephone Encounter (Signed)
Opening error 

## 2021-01-03 ENCOUNTER — Ambulatory Visit (HOSPITAL_COMMUNITY)
Admission: RE | Admit: 2021-01-03 | Discharge: 2021-01-03 | Disposition: A | Discharge status: Home / Self Care | Payer: Managed Care, Other (non HMO) | Attending: Cardiovascular Disease | Admitting: Cardiovascular Disease

## 2021-01-03 ENCOUNTER — Ambulatory Visit (HOSPITAL_COMMUNITY): Payer: Managed Care, Other (non HMO) | Admitting: Certified Registered"

## 2021-01-03 ENCOUNTER — Encounter (HOSPITAL_COMMUNITY)
Admission: RE | Disposition: A | Discharge status: Home / Self Care | Payer: Self-pay | Source: Home / Self Care | Attending: Cardiovascular Disease

## 2021-01-03 ENCOUNTER — Encounter (HOSPITAL_COMMUNITY): Payer: Self-pay | Admitting: Cardiovascular Disease

## 2021-01-03 ENCOUNTER — Ambulatory Visit (HOSPITAL_BASED_OUTPATIENT_CLINIC_OR_DEPARTMENT_OTHER): Payer: Managed Care, Other (non HMO)

## 2021-01-03 ENCOUNTER — Other Ambulatory Visit: Payer: Self-pay

## 2021-01-03 DIAGNOSIS — I25118 Atherosclerotic heart disease of native coronary artery with other forms of angina pectoris: Secondary | ICD-10-CM | POA: Diagnosis not present

## 2021-01-03 DIAGNOSIS — I5032 Chronic diastolic (congestive) heart failure: Secondary | ICD-10-CM | POA: Insufficient documentation

## 2021-01-03 DIAGNOSIS — E119 Type 2 diabetes mellitus without complications: Secondary | ICD-10-CM | POA: Insufficient documentation

## 2021-01-03 DIAGNOSIS — E785 Hyperlipidemia, unspecified: Secondary | ICD-10-CM | POA: Diagnosis not present

## 2021-01-03 DIAGNOSIS — I110 Hypertensive heart disease with heart failure: Secondary | ICD-10-CM | POA: Diagnosis not present

## 2021-01-03 DIAGNOSIS — I639 Cerebral infarction, unspecified: Secondary | ICD-10-CM | POA: Insufficient documentation

## 2021-01-03 DIAGNOSIS — I10 Essential (primary) hypertension: Secondary | ICD-10-CM | POA: Diagnosis not present

## 2021-01-03 DIAGNOSIS — Z955 Presence of coronary angioplasty implant and graft: Secondary | ICD-10-CM | POA: Diagnosis not present

## 2021-01-03 HISTORY — PX: BUBBLE STUDY: SHX6837

## 2021-01-03 HISTORY — PX: TEE WITHOUT CARDIOVERSION: SHX5443

## 2021-01-03 LAB — GLUCOSE, CAPILLARY: Glucose-Capillary: 177 mg/dL — ABNORMAL HIGH (ref 70–99)

## 2021-01-03 SURGERY — ECHOCARDIOGRAM, TRANSESOPHAGEAL
Anesthesia: Monitor Anesthesia Care

## 2021-01-03 MED ORDER — LIDOCAINE HCL (CARDIAC) PF 100 MG/5ML IV SOSY
PREFILLED_SYRINGE | INTRAVENOUS | Status: DC | PRN
  Administered 2021-01-03: 100 mg via INTRAVENOUS

## 2021-01-03 MED ORDER — GLYCOPYRROLATE 0.2 MG/ML IJ SOLN
INTRAMUSCULAR | Status: DC | PRN
  Administered 2021-01-03 (×2): .1 mg via INTRAVENOUS

## 2021-01-03 MED ORDER — BUTAMBEN-TETRACAINE-BENZOCAINE 2-2-14 % EX AERO
INHALATION_SPRAY | CUTANEOUS | Status: DC | PRN
  Administered 2021-01-03: 1 via TOPICAL

## 2021-01-03 MED ORDER — SODIUM CHLORIDE 0.9 % IV SOLN: INTRAVENOUS | Status: DC

## 2021-01-03 MED ORDER — KETAMINE HCL 10 MG/ML IJ SOLN
INTRAMUSCULAR | Status: DC | PRN
  Administered 2021-01-03: 10 mg via INTRAVENOUS
  Administered 2021-01-03: 5 mg via INTRAVENOUS

## 2021-01-03 MED ORDER — PROPOFOL 500 MG/50ML IV EMUL
INTRAVENOUS | Status: DC | PRN
  Administered 2021-01-03: 100 ug/kg/min via INTRAVENOUS

## 2021-01-03 MED ORDER — AMISULPRIDE (ANTIEMETIC) 5 MG/2ML IV SOLN: 10.0000 mg | Freq: Once | INTRAVENOUS | Status: DC | PRN

## 2021-01-03 MED ORDER — KETAMINE HCL 50 MG/5ML IJ SOSY
PREFILLED_SYRINGE | INTRAMUSCULAR | Status: AC
  Filled 2021-01-03: qty 5

## 2021-01-03 MED ORDER — ONDANSETRON HCL 4 MG/2ML IJ SOLN: 4.0000 mg | Freq: Once | INTRAMUSCULAR | Status: DC | PRN

## 2021-01-03 NOTE — Addendum Note (Signed)
Addendum  created 01/03/21 1238 by Marny Lowenstein, CRNA   Intraprocedure Event edited

## 2021-01-03 NOTE — Progress Notes (Signed)
°  Echocardiogram Echocardiogram Transesophageal has been performed.  Kirk Frazier 01/03/2021, 8:51 AM

## 2021-01-03 NOTE — Anesthesia Preprocedure Evaluation (Signed)
Anesthesia Evaluation  Patient identified by MRN, date of birth, ID band Patient awake    Reviewed: Allergy & Precautions, NPO status , Patient's Chart, lab work & pertinent test results  Airway Mallampati: III  TM Distance: >3 FB Neck ROM: Full    Dental  (+) Teeth Intact   Pulmonary former smoker,     + decreased breath sounds      Cardiovascular hypertension, Pt. on home beta blockers and Pt. on medications + CAD and + Cardiac Stents   Rhythm:Regular Rate:Normal     Neuro/Psych CVA, Residual Symptoms    GI/Hepatic negative GI ROS, Neg liver ROS,   Endo/Other  diabetes, Type 2, Oral Hypoglycemic Agents  Renal/GU Renal disease     Musculoskeletal  (+) Arthritis ,   Abdominal Normal abdominal exam  (+)   Peds  Hematology negative hematology ROS (+)   Anesthesia Other Findings   Reproductive/Obstetrics                             Anesthesia Physical Anesthesia Plan  ASA: 3  Anesthesia Plan: MAC   Post-op Pain Management:    Induction: Intravenous  PONV Risk Score and Plan: 0 and Propofol infusion  Airway Management Planned: Natural Airway and Simple Face Mask  Additional Equipment: None  Intra-op Plan:   Post-operative Plan:   Informed Consent: I have reviewed the patients History and Physical, chart, labs and discussed the procedure including the risks, benefits and alternatives for the proposed anesthesia with the patient or authorized representative who has indicated his/her understanding and acceptance.       Plan Discussed with: CRNA  Anesthesia Plan Comments: (Consented for GA. )        Anesthesia Quick Evaluation

## 2021-01-03 NOTE — Interval H&P Note (Signed)
History and Physical Interval Note:  01/03/2021 7:59 AM  Kirk Frazier  has presented today for surgery, with the diagnosis of CVA.  The various methods of treatment have been discussed with the patient and family. After consideration of risks, benefits and other options for treatment, the patient has consented to  Procedure(s): TRANSESOPHAGEAL ECHOCARDIOGRAM (TEE) (N/A) as a surgical intervention.  The patient's history has been reviewed, patient examined, no change in status, stable for surgery.  I have reviewed the patient's chart and labs.  Questions were answered to the patient's satisfaction.     [name]

## 2021-01-03 NOTE — Addendum Note (Signed)
Addendum  created 01/03/21 1239 by Gwyndolyn Saxon, CRNA   Intraprocedure Event edited

## 2021-01-03 NOTE — CV Procedure (Signed)
° ° °  Transesophageal Echocardiogram Note  Kirk Frazier [id number] Aug 17, 1955  Procedure: Transesophageal Echocardiogram Indications: CVA   Procedure Details Consent: Obtained Time Out: Verified patient identification, verified procedure, site/side was marked, verified correct patient position, special equipment/implants available, Radiology Safety Procedures followed,  medications/allergies/relevent history reviewed, required imaging and test results available.  Performed  Medications:  During this procedure the patient is administered Lidocaine 100 mg IV , Ketamine 15 mg IV, robinal 0.2 mg IV and a propofol drip 215 mg IV by Kirk Frazier , CRNA for sedation.  The patient's heart rate, blood pressure, and oxygen saturation are monitored continuously during the procedure. The period of conscious sedation is 45 minutes, of which I was present face-to-face 100% of this time.  Left Ventrical:  normal LV function   Mitral Valve: minimal prolapse of the posterior leaflet.   No MR   Aortic Valve: normal 3 leaflet valve.   No AI, no AS   Tricuspid Valve: poorly visualized,  trace TR   Pulmonic Valve: normal ,  trace PI   Left Atrium/ Left atrial appendage: normal,  good function .  Atrial septum: no evidence of PFO or ASD by color doppler or bubble study   Aorta: normal, no significant plaque    Complications:  Patient did tolerate procedure well.   [name], Kirk Frazier., MD, [facility] 01/03/2021, 8:47 AM

## 2021-01-03 NOTE — Anesthesia Postprocedure Evaluation (Signed)
Anesthesia Post Note  Patient: Kirk Frazier  Procedure(s) Performed: TRANSESOPHAGEAL ECHOCARDIOGRAM (TEE) BUBBLE STUDY     Patient location during evaluation: PACU Anesthesia Type: MAC Level of consciousness: awake and alert Pain management: pain level controlled Vital Signs Assessment: post-procedure vital signs reviewed and stable Respiratory status: spontaneous breathing, nonlabored ventilation, respiratory function stable and patient connected to nasal cannula oxygen Cardiovascular status: stable and blood pressure returned to baseline Postop Assessment: no apparent nausea or vomiting Anesthetic complications: no   No notable events documented.  Last Vitals:  Vitals:   01/03/21 0854 01/03/21 0904  BP: (!) 105/57 (!) 107/57  Pulse: 73 70  Resp: 15 14  Temp:    SpO2: 95% 95%    Last Pain:  Vitals:   01/03/21 0904  TempSrc:   PainSc: 0-No pain                 Effie Berkshire

## 2021-01-03 NOTE — Discharge Instructions (Signed)

## 2021-01-03 NOTE — Transfer of Care (Signed)
Immediate Anesthesia Transfer of Care Note  Patient: Kirk Frazier  Procedure(s) Performed: TRANSESOPHAGEAL ECHOCARDIOGRAM (TEE) BUBBLE STUDY  Patient Location: PACU  Anesthesia Type:MAC  Level of Consciousness: drowsy and patient cooperative  Airway & Oxygen Therapy: Patient Spontanous Breathing and Patient connected to nasal cannula oxygen  Post-op Assessment: Report given to RN and Post -op Vital signs reviewed and stable  Post vital signs: Reviewed and stable  Last Vitals:  Vitals Value Taken Time  BP 94/52 01/03/21 0844  Temp 36.6 C 01/03/21 0844  Pulse 72 01/03/21 0848  Resp 15 01/03/21 0848  SpO2 98 % 01/03/21 0848  Vitals shown include unvalidated device data.  Last Pain:  Vitals:   01/03/21 0844  TempSrc: Temporal  PainSc: 0-No pain         Complications: No notable events documented.

## 2021-01-05 ENCOUNTER — Encounter (HOSPITAL_COMMUNITY): Payer: Self-pay | Admitting: Cardiovascular Disease

## 2021-01-12 ENCOUNTER — Other Ambulatory Visit: Payer: Self-pay | Admitting: Neurology

## 2021-01-12 ENCOUNTER — Ambulatory Visit: Payer: Managed Care, Other (non HMO) | Admitting: Neurology

## 2021-01-12 ENCOUNTER — Encounter: Payer: Self-pay | Admitting: Neurology

## 2021-01-12 VITALS — BP 118/76 | HR 61 | Ht 69.0 in | Wt 280.5 lb

## 2021-01-12 DIAGNOSIS — Z9189 Other specified personal risk factors, not elsewhere classified: Secondary | ICD-10-CM | POA: Diagnosis not present

## 2021-01-12 DIAGNOSIS — I639 Cerebral infarction, unspecified: Secondary | ICD-10-CM

## 2021-01-12 DIAGNOSIS — H34231 Retinal artery branch occlusion, right eye: Secondary | ICD-10-CM | POA: Diagnosis not present

## 2021-01-12 DIAGNOSIS — I480 Paroxysmal atrial fibrillation: Secondary | ICD-10-CM

## 2021-01-12 NOTE — Patient Instructions (Signed)
I had a long d/w patient about his recent cryptogenic stroke, risk for recurrent stroke/TIAs, personally independently reviewed imaging studies and stroke evaluation results and answered questions.Continue aspirin 81 mg daily and clopidogrel 75 mg daily given history of cardiac stents for secondary stroke prevention and maintain strict control of hypertension with blood pressure goal below 130/90, diabetes with hemoglobin A1c goal below 6.5% and lipids with LDL cholesterol goal below 70 mg/dL. I also advised the patient to eat a healthy diet with plenty of whole grains, cereals, fruits and vegetables, exercise regularly and maintain ideal body weight check polysomnogram for sleep apnea and loop recorder for paroxysmal A. fib if the 30-day heart monitor does not show it.    Followup in the future with me in 4 to 5 months or call earlier if necessary. Stroke Prevention Some medical conditions and behaviors can lead to a higher chance of having a stroke. You can help prevent a stroke by eating healthy, exercising, not smoking, and managing any medical conditions you have. Stroke is a leading cause of functional impairment. Primary prevention is particularly important because a majority of strokes are first-time events. Stroke changes the lives of not only those who experience a stroke but also their family and other caregivers. How can this condition affect me? A stroke is a medical emergency and should be treated right away. A stroke can lead to brain damage and can sometimes be life-threatening. If a person gets medical treatment right away, there is a better chance of surviving and recovering from a stroke. What can increase my risk? The following medical conditions may increase your risk of a stroke: Cardiovascular disease. High blood pressure (hypertension). Diabetes. High cholesterol. Sickle cell disease. Blood clotting disorders (hypercoagulable state). Obesity. Sleep disorders (obstructive sleep  apnea). Other risk factors include: Being older than age 15. Having a history of blood clots, stroke, or mini-stroke (transient ischemic attack, TIA). Genetic factors, such as race, ethnicity, or a family history of stroke. Smoking cigarettes or using other tobacco products. Taking birth control pills, especially if you also use tobacco. Heavy use of alcohol or drugs, especially cocaine and methamphetamine. Physical inactivity. What actions can I take to prevent this? Manage your health conditions High cholesterol levels. Eating a healthy diet is important for preventing high cholesterol. If cholesterol cannot be managed through diet alone, you may need to take medicines. Take any prescribed medicines to control your cholesterol as told by your health care provider. Hypertension. To reduce your risk of stroke, try to keep your blood pressure below 130/80. Eating a healthy diet and exercising regularly are important for controlling blood pressure. If these steps are not enough to manage your blood pressure, you may need to take medicines. Take any prescribed medicines to control hypertension as told by your health care provider. Ask your health care provider if you should monitor your blood pressure at home. Have your blood pressure checked every year, even if your blood pressure is normal. Blood pressure increases with age and some medical conditions. Diabetes. Eating a healthy diet and exercising regularly are important parts of managing your blood sugar (glucose). If your blood sugar cannot be managed through diet and exercise, you may need to take medicines. Take any prescribed medicines to control your diabetes as told by your health care provider. Get evaluated for obstructive sleep apnea. Talk to your health care provider about getting a sleep evaluation if you snore a lot or have excessive sleepiness. Make sure that any other medical conditions  you have, such as atrial fibrillation or  atherosclerosis, are managed. Nutrition Follow instructions from your health care provider about what to eat or drink to help manage your health condition. These instructions may include: Reducing your daily calorie intake. Limiting how much salt (sodium) you use to 1,500 milligrams (mg) each day. Using only healthy fats for cooking, such as olive oil, canola oil, or sunflower oil. Eating healthy foods. You can do this by: Choosing foods that are high in fiber, such as whole grains, and fresh fruits and vegetables. Eating at least 5 servings of fruits and vegetables a day. Try to fill one-half of your plate with fruits and vegetables at each meal. Choosing lean protein foods, such as lean cuts of meat, poultry without skin, fish, tofu, beans, and nuts. Eating low-fat dairy products. Avoiding foods that are high in sodium. This can help lower blood pressure. Avoiding foods that have saturated fat, trans fat, and cholesterol. This can help prevent high cholesterol. Avoiding processed and prepared foods. Counting your daily carbohydrate intake.  Lifestyle If you drink alcohol: Limit how much you have to: 0-1 drink a day for women who are not pregnant. 0-2 drinks a day for men. Know how much alcohol is in your drink. In the [place] one drink equals one 12 oz bottle of beer (35mL), one 5 oz glass of wine (12mL), or one 1 oz glass of hard liquor (60mL). Do not use any products that contain nicotine or tobacco. These products include cigarettes, chewing tobacco, and vaping devices, such as e-cigarettes. If you need help quitting, ask your health care provider. Avoid secondhand smoke. Do not use drugs. Activity  Try to stay at a healthy weight. Get at least 30 minutes of exercise on most days, such as: Fast walking. Biking. Swimming. Medicines Take over-the-counter and prescription medicines only as told by your health care provider. Aspirin or blood thinners (antiplatelets or  anticoagulants) may be recommended to reduce your risk of forming blood clots that can lead to stroke. Avoid taking birth control pills. Talk to your health care provider about the risks of taking birth control pills if: You are over 93 years old. You smoke. You get very bad headaches. You have had a blood clot. Where to find more information American Stroke Association: www.strokeassociation.org Get help right away if: You or a loved one has any symptoms of a stroke. "BE FAST" is an easy way to remember the main warning signs of a stroke: B - Balance. Signs are dizziness, sudden trouble walking, or loss of balance. E - Eyes. Signs are trouble seeing or a sudden change in vision. F - Face. Signs are sudden weakness or numbness of the face, or the face or eyelid drooping on one side. A - Arms. Signs are weakness or numbness in an arm. This happens suddenly and usually on one side of the body. S - Speech. Signs are sudden trouble speaking, slurred speech, or trouble understanding what people say. T - Time. Time to call emergency services. Write down what time symptoms started. You or a loved one has other signs of a stroke, such as: A sudden, severe headache with no known cause. Nausea or vomiting. Seizure. These symptoms may represent a serious problem that is an emergency. Do not wait to see if the symptoms will go away. Get medical help right away. Call your local emergency services (911 in the [place] Do not drive yourself to the hospital. Summary You can help to prevent a stroke by  eating healthy, exercising, not smoking, limiting alcohol intake, and managing any medical conditions you may have. Do not use any products that contain nicotine or tobacco. These include cigarettes, chewing tobacco, and vaping devices, such as e-cigarettes. If you need help quitting, ask your health care provider. Remember "BE FAST" for warning signs of a stroke. Get help right away if you or a loved one has any  of these signs. This information is not intended to replace advice given to you by your health care provider. Make sure you discuss any questions you have with your health care provider. Document Revised: 07/20/2019 Document Reviewed: 07/20/2019 Elsevier Patient Education  [place].

## 2021-01-12 NOTE — Progress Notes (Signed)
[facility] [address] [place]. [place] [address] [phone]       OFFICE CONSULT NOTE  [name] Date of Birth:  05-24-1955 Medical Record Number:  [id number]   Referring MD: Shirlee More  Reason for Referral: Stroke  HPI: 66 year old Caucasian male seen today for initial office consultation visit for stroke.  History is obtained from the patient, review of electronic medical records and I personally reviewed pertinent available imaging films in PACS.  He has past medical history of hypertension, diabetes, hyperlipidemia, coronary artery disease, congestive heart failure, history of colon and prostate cancer.  Patient states states he developed sudden onset of right eye vision loss in the upper nasal quadrant along with left upper extremity numbness on 12/11/2020.  He presented to [facility] where he was seen by telemetry neurologist and after careful discussion of risk benefits of tPA he chose not to take it and got hospitalized.  MRI scan showed small tiny right frontal cortical acute as well as subacute infarcts.  His left-sided numbness resolved fairly quickly but right eye upper field partial visual defect has persisted.  2D echo showed normal ejection fraction without cardiac source of embolism.  Study was slightly suboptimal due to poor acoustic windows.  CTA of the head and neck revealed no significant large vessel stenosis or occlusion.  LDL cholesterol is 53.6 mg percent and hemoglobin A1c was elevated at 8.2.  Patient was discharged on aspirin and Plavix which she was given previously taking which he is tolerating well without bleeding or bruising.  He has had no recurrent stroke or TIA symptoms though his partial right eye peripheral vision loss persists.  He denies any prior history of known strokes or TIAs.  He is currently wearing a 30-day heart monitor.  He has upcoming appointment to see Dr. Curt Bears electrophysiologist for presumably loop  recorder.  He denies known prior history of atrial fibrillation, palpitations, syncope or near syncopal events.  ROS:   14 system review of systems is positive for partial vision loss, numbness, decreased hearing, confusion, disorientation all other systems negative  PMH:  Past Medical History:  Diagnosis Date   Benign hypertension 02/10/2015   CAD in native artery 07/07/2014   Overview:  PCI and DES to LCFx 01/08/14 for Troponin  Normal unstable angina   Chronic diastolic heart failure ([place]) 07/07/2014   Coronary artery disease 02/10/2015   Overview:  Managed CARDS 2016: CIRC 95% PCI Resolute stent LAD 40% EF 65%   Essential hypertension 07/07/2014   Gout 02/10/2015   Overview:  2017: sUA=9.6   Hemoglobinemia due to blood transfusion 05/31/2016   Overview:  1982   Hyperlipidemia 07/07/2014   Nephrolithiasis 02/10/2015   Post-traumatic osteoarthritis of both knees 03/22/2016   Rectal bleeding 02/28/2015   Rib pain on right side 05/31/2016   Overview:  2014: injury   Screening for colon cancer 05/30/2016   Screening for prostate cancer 05/30/2016   Type 2 diabetes mellitus without complication, without long-term current use of insulin ([place]) 02/10/2015   Overview:  2016: 6.5 2017: 412 random with A1C 7.7, started MTF   Wellness examination 05/30/2016    Social History:  Social History   Socioeconomic History   Marital status: Significant Other    Spouse name: Not on file   Number of children: Not on file   Years of education: Not on file   Highest education level: Associate degree: academic program  Occupational History   Not on file  Tobacco Use  Smoking status: Former    Passive exposure: Past   Smokeless tobacco: Never  Vaping Use   Vaping Use: Never used  Substance and Sexual Activity   Alcohol use: No   Drug use: No   Sexual activity: Not on file  Other Topics Concern   Not on file  Social History Narrative   Right handed    Caffeine- 2 cups of coffee per day    Live at home with  wife    Social Determinants of Health   Financial Resource Strain: Not on file  Food Insecurity: Not on file  Transportation Needs: Not on file  Physical Activity: Not on file  Stress: Not on file  Social Connections: Not on file  Intimate Partner Violence: Not on file    Medications:   Current Outpatient Medications on File Prior to Visit  Medication Sig Dispense Refill   allopurinol (ZYLOPRIM) 300 MG tablet Take 300 mg by mouth daily.     aspirin EC 81 MG tablet Take 81 mg by mouth daily.      atorvastatin (LIPITOR) 80 MG tablet TAKE 1 TABLET BY MOUTH EVERY DAY 90 tablet 1   clopidogrel (PLAVIX) 75 MG tablet Take 75 mg by mouth daily.     Colchicine 0.6 MG CAPS Take 0.6 mg by mouth daily as needed for other (Gout).     ezetimibe (ZETIA) 10 MG tablet Take 10 mg by mouth daily.     FARXIGA 10 MG TABS tablet Take 10 mg by mouth every morning.     fluorouracil (EFUDEX) 5 % cream Apply 1 application topically daily as needed (Skin rash).     Ivermectin 1 % CREA Apply 1 application topically daily as needed (Skin cancers).     metFORMIN (GLUCOPHAGE-XR) 500 MG 24 hr tablet Take 1,000 mg by mouth 2 (two) times daily.      metoprolol tartrate (LOPRESSOR) 25 MG tablet TAKE 1 TABLET BY MOUTH TWICE A DAY 180 tablet 2   nitroGLYCERIN (NITROSTAT) 0.4 MG SL tablet PLACE 1 TABLET (0.4 MG TOTAL) UNDER THE TONGUE EVERY FIVE (5) MINUTES AS NEEDED FOR CHEST PAIN. 30 tablet 3   potassium chloride (KLOR-CON) 10 MEQ tablet TAKE 1 TABLET BY MOUTH EVERY DAY 90 tablet 2   RYBELSUS 7 MG TABS Take 7 mg by mouth daily.     tadalafil (CIALIS) 10 MG tablet Take 10 mg by mouth daily as needed for erectile dysfunction.     tamsulosin (FLOMAX) 0.4 MG CAPS capsule Take 0.4 mg by mouth 2 (two) times daily.     terbinafine (LAMISIL) 250 MG tablet Take 250 mg by mouth daily.     torsemide (DEMADEX) 20 MG tablet TAKE 1 TABLET (20 MG TOTAL) BY MOUTH 2 (TWO) TIMES DAILY. INSUR WILL PAY ON 02/21/19 180 tablet 3    valACYclovir (VALTREX) 500 MG tablet Take 500 mg by mouth See admin instructions. Take 4 tables by mouth ever 12 hours for 1 day     No current facility-administered medications on file prior to visit.    Allergies:   Allergies  Allergen Reactions   Morphine Nausea And Vomiting   Pseudoephedrine Hcl Rash    Physical Exam General: Obese middle-aged Caucasian male, seated, in no evident distress Head: head normocephalic and atraumatic.   Neck: supple with no carotid or supraclavicular bruits Cardiovascular: regular rate and rhythm, no murmurs Musculoskeletal: no deformity Skin:  no rash/petichiae Vascular:  Normal pulses all extremities  Neurologic Exam Mental Status: Awake and fully  alert. Oriented to place and time. Recent and remote memory intact. Attention span, concentration and fund of knowledge appropriate. Mood and affect appropriate.  Cranial Nerves: Fundoscopic exam reveals sharp disc margins. Pupils equal, briskly reactive to light. Extraocular movements full without nystagmus. Visual fields show only partial right upper nasal visual field defect to confrontation. Hearing intact. Facial sensation intact. Face, tongue, palate moves normally and symmetrically.  Motor: Normal bulk and tone. Normal strength in all tested extremity muscles.  Diminished fine finger movements on the left and orbits right over left upper extremity. Sensory.: intact to touch , pinprick , position and vibratory sensation.  Coordination: Rapid alternating movements normal in all extremities. Finger-to-nose and heel-to-shin performed accurately bilaterally. Gait and Station: Arises from chair without difficulty. Stance is normal. Gait demonstrates normal stride length and balance . Able to heel, toe and tandem walk with moderate difficulty.  Reflexes: 1+ and symmetric. Toes downgoing.   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  Vascular risk factors of hypertension, hyperlipidemia, diabetes, obesity and at risk for sleep apnea     PLAN:I had a long d/w patient about his recent cryptogenic stroke, risk for recurrent stroke/TIAs, personally independently reviewed imaging studies and stroke evaluation results and answered questions.Continue aspirin 81 mg daily and clopidogrel 75 mg daily given history of cardiac stents for secondary stroke prevention and maintain strict control of hypertension with blood pressure goal below 130/90, diabetes with hemoglobin A1c goal below 6.5% and lipids with LDL cholesterol goal below 70 mg/dL. I also advised the patient to eat a healthy diet with plenty of whole grains, cereals, fruits and vegetables, exercise regularly and maintain ideal body weight check polysomnogram for sleep apnea and loop recorder for paroxysmal A. fib if the 30-day heart monitor does not show it. Followup in the future with me in 4 to 5 months or call earlier if necessary.  Greater than 50% time during this 45-minute consultation visit was spent on counseling and coordination of care about his cryptogenic stroke and discussion about stroke prevention treatment and answering questions. Antony Contras, MD  Note: This document was prepared with digital dictation and possible smart phrase technology. Any transcriptional errors that result from this process are unintentional.

## 2021-01-17 ENCOUNTER — Telehealth: Payer: Self-pay | Admitting: *Deleted

## 2021-01-17 NOTE — Telephone Encounter (Signed)
Patient called in today.  He states he received message regarding cardiac event monitor being shipped to him on 01/12/21.  Patient states he had a 30 day monitor applied to him at Dr. Thersa Salt office back on 12/14/21.  I had cancelled that order as a duplicate because there was no documentation monitor was applied, nor was there an enrollment in the [employer] website.   I will cancel the 01/12/21 enrollment.  Roosvelt Harps from our [place] office was contacted regarding possibility of patient wearing a [employer] monitor that has not been enrolled. She will reach out to the nurse that worked with Dr. Dulce Sellar on 12/14/21.

## 2021-01-18 ENCOUNTER — Telehealth: Payer: Self-pay | Admitting: *Deleted

## 2021-01-18 ENCOUNTER — Telehealth: Payer: Self-pay | Admitting: Cardiology

## 2021-01-18 ENCOUNTER — Telehealth: Payer: Self-pay | Admitting: Neurology

## 2021-01-18 DIAGNOSIS — I639 Cerebral infarction, unspecified: Secondary | ICD-10-CM

## 2021-01-18 NOTE — Telephone Encounter (Signed)
Spoke with Jerrel Ivory at [employer] and let her know patient had been wearing the Event Monitor since 12/14/20 but it had not been registered with their company. She said to go ahead and enroll the patient and they would download any back log events that have happened.

## 2021-01-18 NOTE — Telephone Encounter (Signed)
IRW:[facility] ([place]bbie) concerning heart monitor ordered by Dr. Pearlean Brownie. Dr.munley had already order heart monitor 12/14/20 and it was put on the office. Monitor was never registered. Have been back and forth with the company to see what need to be done. Have sent him a new one in the mail and he's going to come to our office tomorrow to have it put on. Want to let Dr. Pearlean Brownie know that.

## 2021-01-18 NOTE — Telephone Encounter (Signed)
Spoke with pt and let him know that the monitor he has been wearing was not connected to the company and they had cancelled it because it was never registered. Pt will bring in both monitors tomorrow to office in [place] and I will put the new monitor he got in the mail on and send the cancelled one back. Pt was understanding and said he would see me tomorrow about 1:30 or 2

## 2021-01-18 NOTE — Telephone Encounter (Signed)
Patient called stating he just recently had a event monitor that ended on 01/14/21.  He states he received a new today, he is going to send this one back.  He wants to make sure he doesn't get charged for this one.

## 2021-01-18 NOTE — Telephone Encounter (Signed)
Spoke with Dr. Marlis Edelson office to let them know what is going on and that we are taking care of the monitor.

## 2021-01-18 NOTE — Telephone Encounter (Signed)
This information will be provided to Dr. Pearlean Brownie. Message printed and placed in his review box.

## 2021-01-19 ENCOUNTER — Ambulatory Visit (INDEPENDENT_AMBULATORY_CARE_PROVIDER_SITE_OTHER): Payer: Managed Care, Other (non HMO)

## 2021-01-19 ENCOUNTER — Other Ambulatory Visit: Payer: Self-pay

## 2021-01-19 DIAGNOSIS — I480 Paroxysmal atrial fibrillation: Secondary | ICD-10-CM | POA: Diagnosis not present

## 2021-01-19 DIAGNOSIS — I639 Cerebral infarction, unspecified: Secondary | ICD-10-CM

## 2021-01-23 ENCOUNTER — Emergency Department (HOSPITAL_COMMUNITY): Payer: Managed Care, Other (non HMO)

## 2021-01-23 ENCOUNTER — Emergency Department (HOSPITAL_COMMUNITY)
Admission: EM | Admit: 2021-01-23 | Discharge: 2021-01-24 | Disposition: A | Discharge status: Home / Self Care | Payer: Managed Care, Other (non HMO) | Attending: Emergency Medicine | Admitting: Emergency Medicine

## 2021-01-23 DIAGNOSIS — I639 Cerebral infarction, unspecified: Secondary | ICD-10-CM | POA: Diagnosis not present

## 2021-01-23 DIAGNOSIS — Z7902 Long term (current) use of antithrombotics/antiplatelets: Secondary | ICD-10-CM | POA: Insufficient documentation

## 2021-01-23 DIAGNOSIS — Z79899 Other long term (current) drug therapy: Secondary | ICD-10-CM | POA: Diagnosis not present

## 2021-01-23 DIAGNOSIS — R200 Anesthesia of skin: Secondary | ICD-10-CM | POA: Diagnosis present

## 2021-01-23 DIAGNOSIS — Z7984 Long term (current) use of oral hypoglycemic drugs: Secondary | ICD-10-CM | POA: Diagnosis not present

## 2021-01-23 DIAGNOSIS — Z7982 Long term (current) use of aspirin: Secondary | ICD-10-CM | POA: Diagnosis not present

## 2021-01-23 HISTORY — DX: Cerebral infarction, unspecified: I63.9

## 2021-01-23 LAB — DIFFERENTIAL
Abs Immature Granulocytes: 0.01 10*3/uL (ref 0.00–0.07)
Basophils Absolute: 0 10*3/uL (ref 0.0–0.1)
Basophils Relative: 1 %
Eosinophils Absolute: 0.1 10*3/uL (ref 0.0–0.5)
Eosinophils Relative: 2 %
Immature Granulocytes: 0 %
Lymphocytes Relative: 24 %
Lymphs Abs: 1.4 10*3/uL (ref 0.7–4.0)
Monocytes Absolute: 0.4 10*3/uL (ref 0.1–1.0)
Monocytes Relative: 7 %
Neutro Abs: 3.9 10*3/uL (ref 1.7–7.7)
Neutrophils Relative %: 66 %

## 2021-01-23 LAB — CBC
HCT: 49.1 % (ref 39.0–52.0)
Hemoglobin: 15.9 g/dL (ref 13.0–17.0)
MCH: 30.8 pg (ref 26.0–34.0)
MCHC: 32.4 g/dL (ref 30.0–36.0)
MCV: 95 fL (ref 80.0–100.0)
Platelets: 196 10*3/uL (ref 150–400)
RBC: 5.17 MIL/uL (ref 4.22–5.81)
RDW: 13.7 % (ref 11.5–15.5)
WBC: 5.9 10*3/uL (ref 4.0–10.5)
nRBC: 0 % (ref 0.0–0.2)

## 2021-01-23 LAB — COMPREHENSIVE METABOLIC PANEL
ALT: 18 U/L (ref 0–44)
AST: 23 U/L (ref 15–41)
Albumin: 4 g/dL (ref 3.5–5.0)
Alkaline Phosphatase: 58 U/L (ref 38–126)
Anion gap: 16 — ABNORMAL HIGH (ref 5–15)
BUN: 19 mg/dL (ref 8–23)
CO2: 25 mmol/L (ref 22–32)
Calcium: 9.2 mg/dL (ref 8.9–10.3)
Chloride: 103 mmol/L (ref 98–111)
Creatinine, Ser: 1.31 mg/dL — ABNORMAL HIGH (ref 0.61–1.24)
GFR, Estimated: >60 mL/min (ref >60)
Glucose, Bld: 209 mg/dL — ABNORMAL HIGH (ref 70–99)
Potassium: 4.1 mmol/L (ref 3.5–5.1)
Sodium: 144 mmol/L (ref 135–145)
Total Bilirubin: 1.3 mg/dL — ABNORMAL HIGH (ref 0.3–1.2)
Total Protein: 6.9 g/dL (ref 6.5–8.1)

## 2021-01-23 LAB — I-STAT CHEM 8, ED
BUN: 21 mg/dL (ref 8–23)
Calcium, Ion: 1.11 mmol/L — ABNORMAL LOW (ref 1.15–1.40)
Chloride: 105 mmol/L (ref 98–111)
Creatinine, Ser: 1.1 mg/dL (ref 0.61–1.24)
Glucose, Bld: 197 mg/dL — ABNORMAL HIGH (ref 70–99)
HCT: 49 % (ref 39.0–52.0)
Hemoglobin: 16.7 g/dL (ref 13.0–17.0)
Potassium: 3.9 mmol/L (ref 3.5–5.1)
Sodium: 142 mmol/L (ref 135–145)
TCO2: 28 mmol/L (ref 22–32)

## 2021-01-23 LAB — PROTIME-INR
INR: 1 (ref 0.8–1.2)
Prothrombin Time: 12.9 seconds (ref 11.4–15.2)

## 2021-01-23 LAB — APTT: aPTT: 29 seconds (ref 24–36)

## 2021-01-23 MED ORDER — SODIUM CHLORIDE 0.9% FLUSH: 3.0000 mL | Freq: Once | INTRAVENOUS | Status: DC

## 2021-01-23 NOTE — ED Provider Triage Note (Signed)
Emergency Medicine Provider Triage Evaluation Note  Kirk Frazier , a 66 y.o. male  was evaluated in triage.  Pt complains of left-sided face numbness.  Patient had a stroke on the 11th and was told to return to the emergency department with any weird symptoms.  Around breakfast at 9 AM he said that the area around his left mouth went numb.  At that time he tried to drink some water and it came out of the left side of his mouth.  Symptoms have decreased since that event this morning.  Review of Systems  Positive: As above Negative: Weakness, trouble speaking or changes to his vision or balance.  Physical Exam  BP 117/84    Pulse (!) 58    Temp 98.9 F (37.2 C) (Oral)    Resp 16    SpO2 98%  Gen:   Awake, no distress   Resp:  Normal effort  MSK:   Moves extremities without difficulty  Other:  No focal deficit, no obvious facial droop.  Sensation intact in the bilateral face.  Finger grip 4 out of 5 bilaterally.  No visual deficits outside of his baseline right eye vision loss due to his stroke on the 11th.  Medical Decision Making  Medically screening exam initiated at 2:54 PM.  Appropriate orders placed.  Kirk Frazier was informed that the remainder of the evaluation will be completed by another provider, this initial triage assessment does not replace that evaluation, and the importance of remaining in the ED until their evaluation is complete.     Saddie Benders, PA-C 01/23/21 1456

## 2021-01-23 NOTE — ED Triage Notes (Signed)
Pt from work for eval of L facial numbness that started at 0930 today. Per EMS no other symptoms. Numbness is improving but still present Hx CVA x 4.

## 2021-01-24 DIAGNOSIS — I639 Cerebral infarction, unspecified: Secondary | ICD-10-CM

## 2021-01-24 NOTE — ED Provider Notes (Signed)
[facility] EMERGENCY DEPARTMENT Provider Note   CSN: [id number] Arrival date & time: 01/23/21  1134     History  Chief Complaint  Patient presents with   Numbness    Kirk Frazier is a 66 y.o. male.  HPI   Patient is a 66 year old male presented emergency room today for episode of left-sided facial numbness.  Patient recently suffered a stroke December 11 followed up with his neurologist Dr. Leonie Man afterwards.  He had a cardiac monitor placed and is scheduled for a sleep study.  Patient states that his episode of left-sided mouth numbness was around 9 AM.  He states that soon after this he tried to drink out of a cup and water dribbled out of the side of his mouth.  He denies any arm or leg weakness and denies any other areas of numbness.  At this time he states that he does not have any symptoms at all.  He also denies any chest pain difficulty breathing lightheadedness or dizziness.  He has left eye vision deficits from stroke in December.  He denies any new vision issues.     Home Medications Prior to Admission medications   Medication Sig Start Date End Date Taking? Authorizing Provider  allopurinol (ZYLOPRIM) 300 MG tablet Take 300 mg by mouth daily. 03/03/20   [provider]  aspirin EC 81 MG tablet Take 81 mg by mouth daily.     [provider]  atorvastatin (LIPITOR) 80 MG tablet TAKE 1 TABLET BY MOUTH EVERY DAY 12/23/20   Richardo Priest, MD  clopidogrel (PLAVIX) 75 MG tablet Take 75 mg by mouth daily. 02/01/20   [provider]  Colchicine 0.6 MG CAPS Take 0.6 mg by mouth daily as needed for other (Gout).    [provider]  ezetimibe (ZETIA) 10 MG tablet Take 10 mg by mouth daily. 03/06/19   [provider]  FARXIGA 10 MG TABS tablet Take 10 mg by mouth every morning. 03/13/19   [provider]  fluorouracil (EFUDEX) 5 % cream Apply 1 application topically daily as needed (Skin rash).    [provider]  Ivermectin 1 % CREA Apply 1 application topically daily as needed (Skin cancers).    [provider]  metFORMIN (GLUCOPHAGE-XR) 500 MG 24 hr tablet Take 1,000 mg by mouth 2 (two) times daily.  04/20/16   [provider]  metoprolol tartrate (LOPRESSOR) 25 MG tablet TAKE 1 TABLET BY MOUTH TWICE A DAY 09/30/20   Munley, Hilton Cork, MD  nitroGLYCERIN (NITROSTAT) 0.4 MG SL tablet PLACE 1 TABLET (0.4 MG TOTAL) UNDER THE TONGUE EVERY FIVE (5) MINUTES AS NEEDED FOR CHEST PAIN. 08/19/20   Richardo Priest, MD  potassium chloride (KLOR-CON) 10 MEQ tablet TAKE 1 TABLET BY MOUTH EVERY DAY 07/06/20   Richardo Priest, MD  RYBELSUS 7 MG TABS Take 7 mg by mouth daily. 11/01/20   [provider]  tadalafil (CIALIS) 10 MG tablet Take 10 mg by mouth daily as needed for erectile dysfunction. 06/26/16   [provider]  tamsulosin (FLOMAX) 0.4 MG CAPS capsule Take 0.4 mg by mouth 2 (two) times daily. 01/26/19   [provider]  terbinafine (LAMISIL) 250 MG tablet Take 250 mg by mouth daily. 09/20/20   [provider]  torsemide (DEMADEX) 20 MG tablet TAKE 1 TABLET (20 MG TOTAL) BY MOUTH 2 (TWO) TIMES DAILY. INSUR WILL PAY ON 02/21/19 04/22/20   Richardo Priest, MD  valACYclovir (  VALTREX) 500 MG tablet Take 500 mg by mouth See admin instructions. Take 4 tables by mouth ever 12 hours for 1 day 01/19/15   [provider]      Allergies    Morphine and Pseudoephedrine hcl    Review of Systems   Review of Systems  Constitutional:  Negative for chills and fever.  HENT:  Negative for congestion.   Eyes:  Negative for pain.  Respiratory:  Negative for cough and shortness of breath.   Cardiovascular:  Negative for chest pain and leg swelling.  Gastrointestinal:  Negative for abdominal distention, abdominal pain and vomiting.  Genitourinary:  Negative for dysuria.  Musculoskeletal:  Negative for myalgias.  Skin:  Negative for rash.  Neurological:  Positive  for numbness. Negative for dizziness and headaches.   Physical Exam Updated Vital Signs BP 130/72 (BP Location: Right Arm)    Pulse (!) 55    Temp 98.3 F (36.8 C) (Oral)    Resp 16    SpO2 100%  Physical Exam Vitals and nursing note reviewed.  Constitutional:      General: He is not in acute distress. HENT:     Head: Normocephalic and atraumatic.     Nose: Nose normal.     Mouth/Throat:     Mouth: Mucous membranes are moist.  Eyes:     General: No scleral icterus. Cardiovascular:     Rate and Rhythm: Normal rate and regular rhythm.     Pulses: Normal pulses.     Heart sounds: Normal heart sounds.  Pulmonary:     Effort: Pulmonary effort is normal. No respiratory distress.     Breath sounds: No wheezing.  Abdominal:     Palpations: Abdomen is soft.     Tenderness: There is no abdominal tenderness.  Musculoskeletal:     Cervical back: Normal range of motion.     Right lower leg: No edema.     Left lower leg: No edema.  Skin:    General: Skin is warm and dry.     Capillary Refill: Capillary refill takes less than 2 seconds.  Neurological:     Mental Status: He is alert. Mental status is at baseline.     Comments: Alert and oriented to self, place, time and event.   Speech is fluent, clear without dysarthria or dysphasia.   Strength 5/5 in upper/lower extremities   Sensation intact in upper/lower extremities   Normal gait.  Negative Romberg. No pronator drift.  Normal finger-to-nose and feet tapping.  CN I not tested  CN II grossly intact visual fields bilaterally. Did not visualize posterior eye.  CN III, IV, VI PERRLA and EOMs intact bilaterally  CN V Intact sensation to sharp and light touch to the face  CN VII facial movements symmetric  CN VIII not tested  CN IX, X no uvula deviation, symmetric rise of soft palate  CN XI 5/5 SCM and trapezius strength bilaterally  CN XII Midline tongue protrusion, symmetric L/R movements    Psychiatric:        Mood and  Affect: Mood normal.        Behavior: Behavior normal.    ED Results / Procedures / Treatments   Labs (all labs ordered are listed, but only abnormal results are displayed) Labs Reviewed  COMPREHENSIVE METABOLIC PANEL - Abnormal; Notable for the following components:      Result Value   Glucose, Bld 209 (*)    Creatinine, Ser 1.31 (*)    Total  Bilirubin 1.3 (*)    Anion gap 16 (*)    All other components within normal limits  I-STAT CHEM 8, ED - Abnormal; Notable for the following components:   Glucose, Bld 197 (*)    Calcium, Ion 1.11 (*)    All other components within normal limits  PROTIME-INR  APTT  CBC  DIFFERENTIAL  CBG MONITORING, ED    EKG None  Radiology CT HEAD WO CONTRAST  Result Date: 01/23/2021 CLINICAL DATA:  A 66 year old male presents for evaluation of TIA, LEFT facial numbness. EXAM: CT HEAD WITHOUT CONTRAST TECHNIQUE: Contiguous axial images were obtained from the base of the skull through the vertex without intravenous contrast. RADIATION DOSE REDUCTION: This exam was performed according to the departmental dose-optimization program which includes automated exposure control, adjustment of the mA and/or kV according to patient size and/or use of iterative reconstruction technique. COMPARISON:  December 11, 2020. FINDINGS: Brain: No evidence of acute infarction, hemorrhage, hydrocephalus, extra-axial collection or mass lesion/mass effect. Signs of generalized atrophy and evidence of punctate calcification seen in the RIGHT cerebral hemisphere showing no change. Evidence of prior infarct in the RIGHT basal ganglia and chronic RIGHT temporoparietal infarct. Findings are unchanged. Vascular: No hyperdense vessel or unexpected calcification. Skull: Normal. Negative for fracture or focal lesion. Sinuses/Orbits: Mucoperiosteal thickening of bilateral maxillary sinuses. This is new compared to prior imaging on the LEFT and worse on the RIGHT. No air-fluid levels in the  sinuses. Orbits are unremarkable. Other: None. IMPRESSION: 1. No acute intracranial abnormality. 2. Evidence of prior infarct in the RIGHT basal ganglia and chronic RIGHT temporoparietal infarct. 3. Mucoperiosteal thickening of bilateral maxillary sinuses. This is new compared to prior imaging on the LEFT and worse on the RIGHT. No air-fluid levels. Correlate with any symptoms of sinusitis. Electronically Signed   By: Zetta Bills M.D.   On: 01/23/2021 13:55   MR BRAIN WO CONTRAST  Result Date: 01/23/2021 CLINICAL DATA:  Neuro deficit, acute, stroke suspected. Left-sided facial numbness beginning earlier today. EXAM: MRI HEAD WITHOUT CONTRAST TECHNIQUE: Multiplanar, multiecho pulse sequences of the brain and surrounding structures were obtained without intravenous contrast. COMPARISON:  Head CT same day.  MRI 12/12/2020 FINDINGS: Brain: Diffusion imaging shows several punctate foci of acute infarction in the right posterior frontal 2 frontoparietal junction region affecting the cortex consistent with micro embolic infarctions. No large or confluent infarction. No sign of hemorrhage or mass effect. The brainstem and cerebellum appear normal. There is old infarction in the right basal ganglia. There are old small vessel ischemic changes of a mild degree within the hemispheric white matter. There are a few old cortical infarctions in the right parietal region, less than a cm. No mass, hemorrhage, hydrocephalus or extra-axial collection. Vascular: Major vessels at the base of the brain show flow. Skull and upper cervical spine: Negative Sinuses/Orbits: Clear/normal Other: None IMPRESSION: Recurrent small acute embolic infarctions in the right middle cerebral artery territory seen in the right posterior frontal lobe and frontoparietal junction region. No large confluent infarction. No mass effect or hemorrhage. Few older small embolic infarctions in the right MCA territory were acute in December of 2022.  Electronically Signed   By: Nelson Chimes M.D.   On: 01/23/2021 18:39    Procedures .Critical Care Performed by: Tedd Sias, PA Authorized by: Tedd Sias, PA   Critical care provider statement:    Critical care time (minutes):  35   Critical care time was exclusive of:  Separately billable procedures and  treating other patients and teaching time   Critical care was necessary to treat or prevent imminent or life-threatening deterioration of the following conditions: Stroke.   Critical care was time spent personally by me on the following activities:  Development of treatment plan with patient or surrogate, review of old charts, re-evaluation of patient's condition, pulse oximetry, ordering and review of radiographic studies, ordering and review of laboratory studies, ordering and performing treatments and interventions, obtaining history from patient or surrogate, examination of patient and evaluation of patient's response to treatment   Care discussed with: admitting provider      Medications Ordered in ED Medications  sodium chloride flush (NS) 0.9 % injection 3 mL (has no administration in time range)    ED Course/ Medical Decision Making/ A&P                           Medical Decision Making Amount and/or Complexity of Data Reviewed Labs: ordered. Radiology: ordered.   This patient presents to the ED for concern of face numbness, this involves a number of treatment options, and is a complaint that carries with it a high risk of complications and morbidity.  The differential diagnosis includes stroke   Co morbidities: Discussed in HPI   Brief History:  Patient is a 66 year old male with past medical history detailed in HPI notably has had a recent stroke at the end of December.  He follows with Dr. Leonie Man of neurology.  He is currently wearing a cardiac monitor and is scheduled for a sleep study and follow-up appointment with cardiology.  He is here in the  emergency department today for left-sided facial numbness and swelling some on the left side of his mouth earlier.  It seems that his symptoms began at 9 AM this morning he is Lucianne Lei negative on my examination and has been in the ER for 14 hours.  Patient has lab work in the ER which is unremarkable for any acute abnormalities.  EMR reviewed including pt PMHx, past surgical history and past visits to ER.   See HPI for more details  Lab Tests:  I ordered and independently interpreted labs.  The pertinent results include:    I personally reviewed all laboratory work and imaging. Metabolic panel without any acute abnormality specifically kidney function within normal limits and no significant electrolyte abnormalities. CBC without leukocytosis or significant anemia.  Patient does have mild hyperglycemia notably.  Kidney function baseline.  Coags within normal limits   Imaging Studies:  Abnormal findings. I personally reviewed all imaging studies. Imaging notable for stroke perhaps in a somewhat watershed distribution.  We will consult neurology    Cardiac Monitoring:  NA NA   Medicines ordered:  None  Critical Interventions:  Identification of stroke   Consults:  I requested consultation with neurology,  and discussed lab and imaging findings as well as pertinent plan - they recommend: Follow-up with neurology and cardiology outpatient.  Dr. Leonel Ramsay evaluated patient at bedside.    Reevaluation:  After the interventions noted above I re-evaluated patient and found that they have :resolved patient is symptoms are completely resolved at this time   Social Determinants of Health:  The patient's social determinants of health were a factor in the care of this patient    Problem List / ED Course:  Stroke   Dispostion:  After consideration of the diagnostic results and the patients response to treatment, I feel that the patent would benefit  from outpatient neurology  and cardiology follow-up.  Return precautions were given he understands that he should return immediately for any new or concerning symptoms such as weakness numbness slurred speech confusion or other symptoms of disequilibrium or dizziness vertigo chest pain shortness of breath, etc.     Final Clinical Impression(s) / ED Diagnoses Final diagnoses:  Cerebrovascular accident (CVA), unspecified mechanism [facility])    Rx / DC Orders ED Discharge Orders     None         Tedd Sias, [place] 01/24/21 0408    Varney Biles, MD 01/24/21 0448

## 2021-01-24 NOTE — Consult Note (Signed)
Neurology Consultation Reason for Consult: Facial numbness Referring Physician: Varney Biles  CC: Facial numbness  History is obtained from: Patient  HPI: Kirk Frazier is a 66 y.o. male with a history of recent stroke who presents with transient facial numbness.  He noticed the numbness this morning, therefore presented to the emergency department.  By the time of his arrival, his numbness was already mostly improving and therefore code stroke was not activated.  He was evaluated prior to being roomed, and a MRI was ordered.  The MRI was obtained while he was still in the waiting room, and demonstrates several punctate infarcts.  He recently completed a stroke work-up with TEE, telemetry, CTA without clear etiology.  He is currently wearing a cardiac monitor but has not heard any results.   LKW: 9:30 AM tpa given?: no, resolving mild symptoms NIHSS score: 1 1A: Level of Consciousness - 0 1B: Ask Month and Age - 0 1C: 'Blink Eyes' & 'Squeeze Hands' - 0 2: Test Horizontal Extraocular Movements - 0 3: Test Visual Fields - 0 4: Test Facial Palsy - 0 5A: Test Left Arm Motor Drift - 1 5B: Test Right Arm Motor Drift - 0 6A: Test Left Leg Motor Drift - 0 6B: Test Right Leg Motor Drift - 0 7: Test Limb Ataxia - 0 8: Test Sensation - 0 9: Test Language/Aphasia- 0 10: Test Dysarthria - 0 11: Test Extinction/Inattention - 0     ROS: A 14 point ROS was performed and is negative except as noted in the HPI.   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    Overview:  1982   Hyperlipidemia 07/07/2014   Nephrolithiasis 02/10/2015   Post-traumatic osteoarthritis of both knees 03/22/2016   Rectal bleeding 02/28/2015   Rib pain on right side 05/31/2016   Overview:  2014: injury   Screening for colon cancer 05/30/2016   Screening for prostate cancer 05/30/2016   Type 2 diabetes mellitus without complication, without long-term current use of insulin ([place]) 02/10/2015   Overview:  2016: 6.5 2017: 412 random with A1C 7.7, started MTF   Wellness examination 05/30/2016     Family History  Problem Relation Age of Onset   Hypertension Mother    Heart failure Mother    Cancer Father    Heart failure Father    Diabetes Father    Thyroid disease Sister    Heart attack Brother    Diabetes Brother      Social History:  reports that he has quit smoking. He has been exposed to tobacco smoke. He has never used smokeless tobacco. He reports that he does not drink alcohol and does not use drugs.   Exam: Current vital signs: BP 133/75 (BP Location: Right Arm)    Pulse (!) 52    Temp 98.3 F (36.8 C) (Oral)    Resp 16    SpO2 98%  Vital signs in last 24 hours: Temp:  [98.3 F (36.8 C)-99.3 F (37.4 C)] 98.3 F (36.8 C) (01/23 2343) Pulse Rate:  [  52-62] 52 (01/23 2343) Resp:  [16] 16 (01/23 2343) BP: (117-133)/(75-100) 133/75 (01/23 2343) SpO2:  [98 %-99 %] 98 % (01/23 2343)   Physical Exam  Constitutional: Appears well-developed and well-nourished.  Psych: Affect appropriate to situation Eyes: No scleral injection HENT: No OP obstruction MSK: no joint deformities.  Cardiovascular: Normal rate and regular rhythm.  Respiratory: Effort normal, non-labored breathing GI: Soft.  No distension. There is no tenderness.  Skin: WDI  Neuro: Mental Status: Patient is awake, alert, oriented to person, place, month, year, and situation. Patient is able to give a clear and coherent history. No signs of aphasia or neglect Cranial Nerves: II: Visual Fields are  full. Pupils are equal, round, and reactive to light.   III,IV, VI: EOMI without ptosis or diploplia.  V: Facial sensation is symmetric to temperature VII: Facial movement is symmetric.  VIII: hearing is intact to voice X: Uvula elevates symmetrically XI: Shoulder shrug is symmetric. XII: tongue is midline without atrophy or fasciculations.  Motor: Tone is normal. Bulk is normal. 5/5 strength was present in right arm and bilateral legs, 4/5 weakness in the left arm Sensory: Sensation is symmetric to light touch and temperature in the arms and legs. Cerebellar: Consistent with weakness on the left, intact on the right     I have reviewed labs in epic and the results pertinent to this consultation are: Creatinine 1.3 CBC-unremarkable  I have reviewed the images obtained: MRI brain-punctate foci of infarct on the right  Impression: 66 year old male with recurrent punctate infarcts.  We currently do not have results from his prolonged cardiac monitoring, but certainly arrhythmia is still a possible etiology.  The strokes have always been on one side, but he does not have any hemodynamically significant stenosis.  He is already on dual antiplatelet therapy and intensive statin therapy.  I am not sure that we would gain much benefit from admission to the hospital at this time, given that his work-up is already complete and very recent.  He will need to follow-up   Recommendations: 1) continue ASA 81mg  daily and plavix 75mg  daily.  2) continue atorvastatin 80mg  daily.  3) swallow eval was negative. 4) given return to normal, no need for therapy evals.  5) follow up for checking his cardiac monitor.    Roland Rack, MD [facility] [phone]  If 7pm- 7am, please page neurology on call as listed in [place].

## 2021-01-24 NOTE — ED Notes (Signed)
No answer x2 

## 2021-01-24 NOTE — Discharge Instructions (Signed)
Please follow-up with Dr. Leonie Man.  Please continue take your aspirin Plavix and all other medications as prescribed.  Return immediately to the emergency room should experience any new or concerning symptoms such as numbness or weakness.

## 2021-01-31 ENCOUNTER — Institutional Professional Consult (permissible substitution): Payer: Managed Care, Other (non HMO) | Admitting: Cardiology

## 2021-02-07 ENCOUNTER — Telehealth: Payer: Self-pay | Admitting: Cardiology

## 2021-02-07 NOTE — Telephone Encounter (Signed)
Connecting the pt to the device clinic... pt lost one of the adhesive pieces that hold his monitor on after removing it for an MRI... transferred to device clinic for further assistance.

## 2021-02-07 NOTE — Telephone Encounter (Signed)
Called patient to get more information. Patient stated that he needed more strips for his heart monitor. Informed patient that more strips were left at the front desk for him to pick up. Patient stated he would come by the office today and pick up the extra strips. Patient has no further questions at this time.

## 2021-02-07 NOTE — Telephone Encounter (Signed)
Patient called in because he needs to use to that sticks the heart monitor to his chest. Please advise

## 2021-02-14 NOTE — Progress Notes (Signed)
Cardiology Office Note:    Date:  02/15/2021   ID:  Kirk Frazier, DOB [birth year], MRN [id number]  PCP:  Algis Greenhouse, MD  Cardiologist:  Shirlee More, MD    Referring MD: Algis Greenhouse, MD    ASSESSMENT:    1. Cerebrovascular accident (CVA), unspecified mechanism ([place])   2. Hypertensive heart disease with chronic diastolic congestive heart failure ([place])   3. Coronary artery disease with stable angina pectoris, unspecified vessel or lesion type, unspecified whether native or transplanted heart ([place])   4. Hyperlipidemia, unspecified hyperlipidemia type   5. CAD in native artery    PLAN:    In order of problems listed above:  He has had recurrent stroke concern of underlying atrial fibrillation wearing a 30-day monitor is going to be returned 02/19/2023and I told him I think he should have an implantable loop recorder quickly and it does not show atrial fibrillation.  He will be seeing a neurologist and I gave him a note that he should not return to work at this time Stable continue current medical therapy heart failure is compensated Stable CAD no angina following PCI and on current medical treatment including dual antiplatelet and high intensity statin   Next appointment: 3 months   Medication Adjustments/Labs and Tests Ordered: Current medicines are reviewed at length with the patient today.  Concerns regarding medicines are outlined above.  No orders of the defined types were placed in this encounter.  No orders of the defined types were placed in this encounter.   Chief Complaint  Patient presents with   Surgical questions     Regarding Knee replacement on the R and work status     History of Present Illness:    Kirk Frazier is a 66 y.o. male with a hx of CAD with PCI and stent to the LAD 2016 hypertensive heart disease with chronic heart failure remote stroke and recent stroke 12/12/2020 last seen by me 12/14/2020.  He was seen in the emergency room  [facility] 01/23/2021 with recurrent neurologic symptoms and was advised to follow-up with neurology and cardiology.  He was wearing a 30-day monitor.  Compliance with diet, lifestyle and medications: Yes  Transesophageal echocardiogram 01/03/2021 showed no findings left atrial or left atrial appendage thrombus or valvular abnormality and agitated saline contrast injection showed no evidence of shunt.  There is no significant atherosclerosis in the aorta.  He has had another event possible stroke waiting neurology evaluation He also has had falls at home and has difficulty ambulating uses a cane He has not returned to work because of cognitive dysfunction and visual loss He is a 30-day monitor but in some fashion was never activated and he has another one Amao that comes off in 19 February.  If we do not document atrial fibrillation I think he needs an implanted loop recorder Not having edema chest pain shortness of breath or palpitation  Past Medical History:  Diagnosis Date   Benign hypertension 02/10/2015   CAD in native artery 07/07/2014   Overview:  PCI and DES to LCFx 01/08/14 for Troponin  Normal unstable angina   Chronic diastolic heart failure ([place]) 07/07/2014   Coronary artery disease 02/10/2015   Overview:  Managed CARDS 2016: CIRC 95% PCI Resolute stent LAD 40% EF 65%   Essential hypertension 07/07/2014   Gout 02/10/2015   Overview:  2017: sUA=9.6   Hemoglobinemia due to blood transfusion 05/31/2016   Overview:  1982   Hyperlipidemia 07/07/2014  Nephrolithiasis 02/10/2015   Post-traumatic osteoarthritis of both knees 03/22/2016   Rectal bleeding 02/28/2015   Rib pain on right side 05/31/2016   Overview:  2014: injury   Screening for colon cancer 05/30/2016   Screening for prostate cancer 05/30/2016   Stroke ([place]) 01/23/2021   Type 2 diabetes mellitus without complication, without long-term current use of insulin ([place]) 02/10/2015   Overview:  2016: 6.5 2017: 412  random with A1C 7.7, started MTF   Wellness examination 05/30/2016    Past Surgical History:  Procedure Laterality Date   BUBBLE STUDY  01/03/2021   Procedure: BUBBLE STUDY;  Surgeon: Thayer Headings, MD;  Location: [facility];  Service: Cardiovascular;;   CARPAL TUNNEL RELEASE     CORONARY ANGIOPLASTY WITH STENT PLACEMENT     KNEE SURGERY     TEE WITHOUT CARDIOVERSION N/A 01/03/2021   Procedure: TRANSESOPHAGEAL ECHOCARDIOGRAM (TEE);  Surgeon: Acie Fredrickson Wonda Cheng, MD;  Location: [facility] ENDOSCOPY;  Service: Cardiovascular;  Laterality: N/A;   TRIGGER FINGER RELEASE      Current Medications: Current Meds  Medication Sig   allopurinol (ZYLOPRIM) 300 MG tablet Take 300 mg by mouth daily.   aspirin EC 81 MG tablet Take 81 mg by mouth daily.    atorvastatin (LIPITOR) 80 MG tablet TAKE 1 TABLET BY MOUTH EVERY DAY (Patient taking differently: Take 80 mg by mouth daily.)   clopidogrel (PLAVIX) 75 MG tablet Take 75 mg by mouth daily.   ezetimibe (ZETIA) 10 MG tablet Take 10 mg by mouth daily.   FARXIGA 10 MG TABS tablet Take 10 mg by mouth every morning.   fluorouracil (EFUDEX) 5 % cream Apply 1 application topically daily as needed (Skin rash).   Ivermectin 1 % CREA Apply 1 application topically daily as needed (Skin cancers).   metFORMIN (GLUCOPHAGE-XR) 500 MG 24 hr tablet Take 1,000 mg by mouth 2 (two) times daily.    metoprolol tartrate (LOPRESSOR) 25 MG tablet TAKE 1 TABLET BY MOUTH TWICE A DAY (Patient taking differently: Take 25 mg by mouth 2 (two) times daily.)   nitroGLYCERIN (NITROSTAT) 0.4 MG SL tablet PLACE 1 TABLET (0.4 MG TOTAL) UNDER THE TONGUE EVERY FIVE (5) MINUTES AS NEEDED FOR CHEST PAIN. (Patient taking differently: Place 0.4 mg under the tongue every 5 (five) minutes as needed for chest pain. PLACE 1 TABLET (0.4 MG TOTAL) UNDER THE TONGUE EVERY FIVE (5) MINUTES AS NEEDED FOR CHEST PAIN.)   potassium chloride (KLOR-CON) 10 MEQ tablet TAKE 1 TABLET BY MOUTH EVERY DAY (Patient taking  differently: Take 10 mEq by mouth daily.)   RYBELSUS 7 MG TABS Take 7 mg by mouth daily.   tadalafil (CIALIS) 10 MG tablet Take 10 mg by mouth daily as needed for erectile dysfunction.   tamsulosin (FLOMAX) 0.4 MG CAPS capsule Take 0.4 mg by mouth 2 (two) times daily.   terbinafine (LAMISIL) 250 MG tablet Take 250 mg by mouth daily.   torsemide (DEMADEX) 20 MG tablet TAKE 1 TABLET (20 MG TOTAL) BY MOUTH 2 (TWO) TIMES DAILY. INSUR WILL PAY ON 02/21/19 (Patient taking differently: Take 20 mg by mouth 2 (two) times daily.)   valACYclovir (VALTREX) 500 MG tablet Take 500 mg by mouth See admin instructions. Take 4 tables by mouth ever 12 hours for 1 day     Allergies:   Morphine and Pseudoephedrine hcl   Social History   Socioeconomic History   Marital status: Significant Other    Spouse name: Not on file   Number of  children: Not on file   Years of education: Not on file   Highest education level: Associate degree: academic program  Occupational History   Not on file  Tobacco Use   Smoking status: Former    Passive exposure: Past   Smokeless tobacco: Never  Vaping Use   Vaping Use: Never used  Substance and Sexual Activity   Alcohol use: No   Drug use: No   Sexual activity: Not on file  Other Topics Concern   Not on file  Social History Narrative   Right handed    Caffeine- 2 cups of coffee per day    Live at home with wife    Social Determinants of Health   Financial Resource Strain: Not on file  Food Insecurity: Not on file  Transportation Needs: Not on file  Physical Activity: Not on file  Stress: Not on file  Social Connections: Not on file     Family History: The patient's family history includes Cancer in his father; Diabetes in his brother and father; Heart attack in his brother; Heart failure in his father and mother; Hypertension in his mother; Thyroid disease in his sister. ROS:   Please see the history of present illness.    All other systems reviewed and are  negative.  EKGs/Labs/Other Studies Reviewed:    The following studies were reviewed today:   Recent Labs: 01/23/2021: ALT 18; BUN 21; Creatinine, Ser 1.10; Hemoglobin 16.7; Platelets 196; Potassium 3.9; Sodium 142  Recent Lipid Panel    Component Value Date/Time   CHOL 116 06/26/2019 0918   TRIG 114 06/26/2019 0918   HDL 40 06/26/2019 0918   CHOLHDL 2.9 06/26/2019 0918   LDLCALC 55 06/26/2019 0918    Physical Exam:    VS:  BP 118/72 (BP Location: Right Arm, Patient Position: Sitting)    Pulse 65    Ht 5\' 9"  (1.753 m)    Wt 277 lb 12.8 oz (126 kg)    SpO2 95%    BMI 41.02 kg/m     Wt Readings from Last 3 Encounters:  02/15/21 277 lb 12.8 oz (126 kg)  01/12/21 280 lb 8 oz (127.2 kg)  01/03/21 265 lb (120.2 kg)     GEN:  Well nourished, well developed in no acute distress HEENT: Normal NECK: No JVD; No carotid bruits LYMPHATICS: No lymphadenopathy CARDIAC: RRR, no murmurs, rubs, gallops RESPIRATORY:  Clear to auscultation without rales, wheezing or rhonchi  ABDOMEN: Soft, non-tender, non-distended MUSCULOSKELETAL:  No edema; No deformity  SKIN: Warm and dry NEUROLOGIC:  Alert and oriented x 3 PSYCHIATRIC:  Normal affect    Signed, Shirlee More, MD  02/15/2021 4:33 PM    [place]

## 2021-02-15 ENCOUNTER — Other Ambulatory Visit: Payer: Self-pay

## 2021-02-15 ENCOUNTER — Ambulatory Visit: Payer: Managed Care, Other (non HMO) | Admitting: Cardiology

## 2021-02-15 ENCOUNTER — Encounter: Payer: Self-pay | Admitting: Cardiology

## 2021-02-15 VITALS — BP 118/72 | HR 65 | Ht 69.0 in | Wt 277.8 lb

## 2021-02-15 DIAGNOSIS — I25118 Atherosclerotic heart disease of native coronary artery with other forms of angina pectoris: Secondary | ICD-10-CM | POA: Diagnosis not present

## 2021-02-15 DIAGNOSIS — I5032 Chronic diastolic (congestive) heart failure: Secondary | ICD-10-CM

## 2021-02-15 DIAGNOSIS — I2510 Atherosclerotic heart disease of native coronary artery without angina pectoris: Secondary | ICD-10-CM

## 2021-02-15 DIAGNOSIS — I110 Hypertensive heart disease with heart failure: Secondary | ICD-10-CM

## 2021-02-15 DIAGNOSIS — I639 Cerebral infarction, unspecified: Secondary | ICD-10-CM

## 2021-02-15 DIAGNOSIS — E785 Hyperlipidemia, unspecified: Secondary | ICD-10-CM | POA: Diagnosis not present

## 2021-02-15 NOTE — Patient Instructions (Signed)

## 2021-02-17 ENCOUNTER — Telehealth: Payer: Self-pay | Admitting: Cardiology

## 2021-02-17 DIAGNOSIS — Z0279 Encounter for issue of other medical certificate: Secondary | ICD-10-CM

## 2021-02-17 NOTE — Telephone Encounter (Signed)
Forms from [employer] for long term disability received on 02/17/2021. Cash received for 29 dollar fee. Forms taken to Dr. Joya Gaskins box for completion   02/17/2021 kbl

## 2021-02-28 ENCOUNTER — Other Ambulatory Visit: Payer: Self-pay | Admitting: Cardiology

## 2021-03-09 ENCOUNTER — Ambulatory Visit: Payer: Managed Care, Other (non HMO) | Admitting: Neurology

## 2021-03-09 ENCOUNTER — Encounter: Payer: Self-pay | Admitting: Neurology

## 2021-03-09 VITALS — BP 124/73 | HR 62 | Ht 69.0 in | Wt 281.0 lb

## 2021-03-09 DIAGNOSIS — Z9189 Other specified personal risk factors, not elsewhere classified: Secondary | ICD-10-CM

## 2021-03-09 DIAGNOSIS — R200 Anesthesia of skin: Secondary | ICD-10-CM

## 2021-03-09 DIAGNOSIS — R202 Paresthesia of skin: Secondary | ICD-10-CM

## 2021-03-09 DIAGNOSIS — I639 Cerebral infarction, unspecified: Secondary | ICD-10-CM

## 2021-03-09 NOTE — Progress Notes (Signed)
[facility] [address] [place]. [place] [address] [phone]       OFFICE FOLLOW UP VISIT NOTE  [name] Date of Birth:  1955-11-03 Medical Record Number:  [id number]   Referring MD: Kirk Frazier  Reason for Referral: Stroke  HPI: Initial visit 01/12/2021: 66 year old Caucasian male seen today for initial office consultation visit for stroke.  History is obtained from the patient, review of electronic medical records and I personally reviewed pertinent available imaging films in PACS.  He has past medical history of hypertension, diabetes, hyperlipidemia, coronary artery disease, congestive heart failure, history of colon and prostate cancer.  Patient states states he developed sudden onset of right eye vision loss in the upper nasal quadrant along with left upper extremity numbness on 12/11/2020.  He presented to [facility] where he was seen by telemetry neurologist and after careful discussion of risk benefits of tPA he chose not to take it and got hospitalized.  MRI scan showed small tiny right frontal cortical acute as well as subacute infarcts.  His left-sided numbness resolved fairly quickly but right eye upper field partial visual defect has persisted.  2D echo showed normal ejection fraction without cardiac source of embolism.  Study was slightly suboptimal due to poor acoustic windows.  CTA of the head and neck revealed no significant large vessel stenosis or occlusion.  LDL cholesterol is 53.6 mg percent and hemoglobin A1c was elevated at 8.2.  Patient was discharged on aspirin and Plavix which she was given previously taking which he is tolerating well without bleeding or bruising.  He has had no recurrent stroke or TIA symptoms though his partial right eye peripheral vision loss persists.  He denies any prior history of known strokes or TIAs.  He is currently wearing a 30-day heart monitor.  He has upcoming appointment to see Dr. Curt Bears  electrophysiologist for presumably loop recorder.  He denies known prior history of atrial fibrillation, palpitations, syncope or near syncopal events. Update 03/09/2021 : Patient is seen for follow-up after last visit 2 months ago.  He was seen in the emergency room on 01/24/2021 with sudden onset of transient facial numbness.  At the time he arrived to the ER numbness was resolving.  NIH stroke scale was 0.  CT head was unremarkable and MRI scan of the brain showed recurrent small acute embolic infarcts in the right posterior frontal lobe and frontoparietal junction in the MCA territory.  Patient was restarted on dual antiplatelet therapy aspirin and Plavix and was discharged home as there was no need to be admitted for any further work-up and patient had no therapy needs.  Patient states is done well since discharge he has had no other recurrent episodes of numbness or strokes.  He underwent 30-day outpatient cardiac monitor from 01/19/2021 to 02/17/2021 which showed no evidence of atrial fibrillation or significant arrhythmias.  He underwent a TEE on 01/03/2021 which was negative for any cardiac source for embolism, clot or PFO.  Patient remains on aspirin and Plavix and tolerating well with minor bruising but no bleeding.  He has an upcoming appointment with Dr. Curt Bears but is not sure if it is for loop recorder or discussion of results of 30-day heart monitor.  He has no other complaints today.  He is filed for long-term disability.  Continues to walk with a cane but this is mostly because he favors his right leg which has right knee replacement done. ROS:   14 system review of systems is  positive for partial vision loss, numbness, decreased hearing, confusion, disorientation all other systems negative  PMH:  Past Medical History:  Diagnosis Date   Benign hypertension 02/10/2015   CAD in native artery 07/07/2014   Overview:  PCI and DES to LCFx 01/08/14 for Troponin  Normal unstable angina   Chronic  diastolic heart failure ([place]) 07/07/2014   Coronary artery disease 02/10/2015   Overview:  Managed CARDS 2016: CIRC 95% PCI Resolute stent LAD 40% EF 65%   Essential hypertension 07/07/2014   Gout 02/10/2015   Overview:  2017: sUA=9.6   Hemoglobinemia due to blood transfusion 05/31/2016   Overview:  1982   Hyperlipidemia 07/07/2014   Nephrolithiasis 02/10/2015   Post-traumatic osteoarthritis of both knees 03/22/2016   Rectal bleeding 02/28/2015   Rib pain on right side 05/31/2016   Overview:  2014: injury   Screening for colon cancer 05/30/2016   Screening for prostate cancer 05/30/2016   Stroke ([place]) 01/23/2021   Type 2 diabetes mellitus without complication, without long-term current use of insulin ([place]) 02/10/2015   Overview:  2016: 6.5 2017: 412 random with A1C 7.7, started MTF   Wellness examination 05/30/2016    Social History:  Social History   Socioeconomic History   Marital status: Significant Other    Spouse name: Not on file   Number of children: Not on file   Years of education: Not on file   Highest education level: Associate degree: academic program  Occupational History   Not on file  Tobacco Use   Smoking status: Former    Passive exposure: Past   Smokeless tobacco: Never  Vaping Use   Vaping Use: Never used  Substance and Sexual Activity   Alcohol use: No   Drug use: No   Sexual activity: Not on file  Other Topics Concern   Not on file  Social History Narrative   Right handed    Caffeine- 2 cups of coffee per day    Live at home with wife    Social Determinants of Health   Financial Resource Strain: Not on file  Food Insecurity: Not on file  Transportation Needs: Not on file  Physical Activity: Not on file  Stress: Not on file  Social Connections: Not on file  Intimate Partner Violence: Not on file    Medications:   Current Outpatient Medications on File Prior to Visit  Medication Sig Dispense Refill   allopurinol (ZYLOPRIM) 300 MG  tablet Take 300 mg by mouth daily.     atorvastatin (LIPITOR) 80 MG tablet TAKE 1 TABLET BY MOUTH EVERY DAY (Patient taking differently: Take 80 mg by mouth daily.) 90 tablet 1   clopidogrel (PLAVIX) 75 MG tablet Take 75 mg by mouth daily.     Colchicine 0.6 MG CAPS Take 0.6 mg by mouth daily as needed for other (Gout).     ezetimibe (ZETIA) 10 MG tablet Take 10 mg by mouth daily.     FARXIGA 10 MG TABS tablet Take 10 mg by mouth every morning.     fluorouracil (EFUDEX) 5 % cream Apply 1 application topically daily as needed (Skin rash).     Ivermectin 1 % CREA Apply 1 application topically daily as needed (Skin cancers).     metFORMIN (GLUCOPHAGE-XR) 500 MG 24 hr tablet Take 1,000 mg by mouth 2 (two) times daily.      metoprolol tartrate (LOPRESSOR) 25 MG tablet TAKE 1 TABLET BY MOUTH TWICE A DAY (Patient taking differently: Take 25 mg by mouth  2 (two) times daily.) 180 tablet 2   nitroGLYCERIN (NITROSTAT) 0.4 MG SL tablet PLACE 1 TABLET (0.4 MG TOTAL) UNDER THE TONGUE EVERY FIVE (5) MINUTES AS NEEDED FOR CHEST PAIN. (Patient taking differently: Place 0.4 mg under the tongue every 5 (five) minutes as needed for chest pain. PLACE 1 TABLET (0.4 MG TOTAL) UNDER THE TONGUE EVERY FIVE (5) MINUTES AS NEEDED FOR CHEST PAIN.) 30 tablet 3   potassium chloride (KLOR-CON) 10 MEQ tablet TAKE 1 TABLET BY MOUTH EVERY DAY 90 tablet 2   RYBELSUS 7 MG TABS Take 7 mg by mouth daily.     tadalafil (CIALIS) 10 MG tablet Take 10 mg by mouth daily as needed for erectile dysfunction.     tamsulosin (FLOMAX) 0.4 MG CAPS capsule Take 0.4 mg by mouth 2 (two) times daily.     terbinafine (LAMISIL) 250 MG tablet Take 250 mg by mouth daily.     torsemide (DEMADEX) 20 MG tablet TAKE 1 TABLET (20 MG TOTAL) BY MOUTH 2 (TWO) TIMES DAILY. INSUR WILL PAY ON 02/21/19 180 tablet 3   valACYclovir (VALTREX) 500 MG tablet Take 500 mg by mouth See admin instructions. Take 4 tables by mouth ever 12 hours for 1 day     No current  facility-administered medications on file prior to visit.    Allergies:   Allergies  Allergen Reactions   Morphine Nausea And Vomiting   Pseudoephedrine Hcl Rash    Physical Exam General: Obese middle-aged Caucasian male, seated, in no evident distress Head: head normocephalic and atraumatic.   Neck: supple with no carotid or supraclavicular bruits Cardiovascular: regular rate and rhythm, no murmurs Musculoskeletal: no deformity Skin:  no rash/but bilateral forearm bruising Vascular:  Normal pulses all extremities  Neurologic Exam Mental Status: Awake and fully alert. Oriented to place and time. Recent and remote memory intact. Attention span, concentration and fund of knowledge appropriate. Mood and affect appropriate.  Cranial Nerves: Fundoscopic exam not done. Pupils equal, briskly reactive to light. Extraocular movements full without nystagmus. Visual fields show only partial right upper nasal visual field defect to confrontation. Hearing intact. Facial sensation intact. Face, tongue, palate moves normally and symmetrically.  Motor: Normal bulk and tone. Normal strength in all tested extremity muscles.  Diminished fine finger movements on the left and orbits right over left upper extremity. Sensory.: intact to touch , pinprick , position and vibratory sensation.  Coordination: Rapid alternating movements normal in all extremities. Finger-to-nose and heel-to-shin performed accurately bilaterally. Gait and Station: Arises from chair without difficulty. Stance is normal. Gait demonstrates normal stride length and balance .  Walks with a cane with slight favoring of the right leg.  Reflexes: 1+ and symmetric. Toes downgoing.   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  Recurrent right MCA branch embolic infarcts in January 2023 as  well of cryptogenic etiology.  Vascular risk factors of hypertension, hyperlipidemia, diabetes, obesity and at risk for sleep apnea     PLAN:I had a long d/w patient  and his wife about his recent recurrent cryptogenic strokes, risk for recurrent stroke/TIAs, personally independently reviewed imaging studies and stroke evaluation results and answered questions.Continue Plavix 75 mg daily alone and stop aspirin now for secondary stroke prevention and maintain strict control of hypertension with blood pressure goal below 130/90, diabetes with hemoglobin A1c goal below 6.5% and lipids  with LDL cholesterol goal below 70 mg/dL. I also advised the patient to eat a healthy diet with plenty of whole grains, cereals, fruits and vegetables, exercise regularly and maintain ideal body weight .referred to electrophysiology for loop recorder insertion for paroxysmal A-fib.  Refer to sleep lab for polysomnogram for sleep apnea.  Followup in the future with me in 6 months or call earlier if necessary. Greater than 50% time during this 40-minute   visit was spent on counseling and coordination of care about his cryptogenic stroke and discussion about stroke prevention treatment and answering questions. Antony Contras, MD  Note: This document was prepared with digital dictation and possible smart phrase technology. Any transcriptional errors that result from this process are unintentional.

## 2021-03-09 NOTE — Patient Instructions (Signed)
I had a long d/w patient  and his wife about his recent recurrent cryptogenic strokes, risk for recurrent stroke/TIAs, personally independently reviewed imaging studies and stroke evaluation results and answered questions.Continue Plavix 75 mg daily alone and stop aspirin now for secondary stroke prevention and maintain strict control of hypertension with blood pressure goal below 130/90, diabetes with hemoglobin A1c goal below 6.5% and lipids with LDL cholesterol goal below 70 mg/dL. I also advised the patient to eat a healthy diet with plenty of whole grains, cereals, fruits and vegetables, exercise regularly and maintain ideal body weight .referred to electrophysiology for loop recorder insertion for paroxysmal A-fib.  Refer to sleep lab for polysomnogram for sleep apnea.  Followup in the future with me in 6 months or call earlier if necessary.  Stroke Prevention Some medical conditions and behaviors can lead to a higher chance of having a stroke. You can help prevent a stroke by eating healthy, exercising, not smoking, and managing any medical conditions you have. Stroke is a leading cause of functional impairment. Primary prevention is particularly important because a majority of strokes are first-time events. Stroke changes the lives of not only those who experience a stroke but also their family and other caregivers. How can this condition affect me? A stroke is a medical emergency and should be treated right away. A stroke can lead to brain damage and can sometimes be life-threatening. If a person gets medical treatment right away, there is a better chance of surviving and recovering from a stroke. What can increase my risk? The following medical conditions may increase your risk of a stroke: Cardiovascular disease. High blood pressure (hypertension). Diabetes. High cholesterol. Sickle cell disease. Blood clotting disorders (hypercoagulable state). Obesity. Sleep disorders (obstructive sleep  apnea). Other risk factors include: Being older than age 28. Having a history of blood clots, stroke, or mini-stroke (transient ischemic attack, TIA). Genetic factors, such as race, ethnicity, or a family history of stroke. Smoking cigarettes or using other tobacco products. Taking birth control pills, especially if you also use tobacco. Heavy use of alcohol or drugs, especially cocaine and methamphetamine. Physical inactivity. What actions can I take to prevent this? Manage your health conditions High cholesterol levels. Eating a healthy diet is important for preventing high cholesterol. If cholesterol cannot be managed through diet alone, you may need to take medicines. Take any prescribed medicines to control your cholesterol as told by your health care provider. Hypertension. To reduce your risk of stroke, try to keep your blood pressure below 130/80. Eating a healthy diet and exercising regularly are important for controlling blood pressure. If these steps are not enough to manage your blood pressure, you may need to take medicines. Take any prescribed medicines to control hypertension as told by your health care provider. Ask your health care provider if you should monitor your blood pressure at home. Have your blood pressure checked every year, even if your blood pressure is normal. Blood pressure increases with age and some medical conditions. Diabetes. Eating a healthy diet and exercising regularly are important parts of managing your blood sugar (glucose). If your blood sugar cannot be managed through diet and exercise, you may need to take medicines. Take any prescribed medicines to control your diabetes as told by your health care provider. Get evaluated for obstructive sleep apnea. Talk to your health care provider about getting a sleep evaluation if you snore a lot or have excessive sleepiness. Make sure that any other medical conditions you have, such as  atrial fibrillation or  atherosclerosis, are managed. Nutrition Follow instructions from your health care provider about what to eat or drink to help manage your health condition. These instructions may include: Reducing your daily calorie intake. Limiting how much salt (sodium) you use to 1,500 milligrams (mg) each day. Using only healthy fats for cooking, such as olive oil, canola oil, or sunflower oil. Eating healthy foods. You can do this by: Choosing foods that are high in fiber, such as whole grains, and fresh fruits and vegetables. Eating at least 5 servings of fruits and vegetables a day. Try to fill one-half of your plate with fruits and vegetables at each meal. Choosing lean protein foods, such as lean cuts of meat, poultry without skin, fish, tofu, beans, and nuts. Eating low-fat dairy products. Avoiding foods that are high in sodium. This can help lower blood pressure. Avoiding foods that have saturated fat, trans fat, and cholesterol. This can help prevent high cholesterol. Avoiding processed and prepared foods. Counting your daily carbohydrate intake.  Lifestyle If you drink alcohol: Limit how much you have to: 0-1 drink a day for women who are not pregnant. 0-2 drinks a day for men. Know how much alcohol is in your drink. In the [place] one drink equals one 12 oz bottle of beer (355mL), one 5 oz glass of wine (148mL), or one 1 oz glass of hard liquor (44mL). Do not use any products that contain nicotine or tobacco. These products include cigarettes, chewing tobacco, and vaping devices, such as e-cigarettes. If you need help quitting, ask your health care provider. Avoid secondhand smoke. Do not use drugs. Activity  Try to stay at a healthy weight. Get at least 30 minutes of exercise on most days, such as: Fast walking. Biking. Swimming. Medicines Take over-the-counter and prescription medicines only as told by your health care provider. Aspirin or blood thinners (antiplatelets or  anticoagulants) may be recommended to reduce your risk of forming blood clots that can lead to stroke. Avoid taking birth control pills. Talk to your health care provider about the risks of taking birth control pills if: You are over 66 years old. You smoke. You get very bad headaches. You have had a blood clot. Where to find more information American Stroke Association: www.strokeassociation.org Get help right away if: You or a loved one has any symptoms of a stroke. "BE FAST" is an easy way to remember the main warning signs of a stroke: B - Balance. Signs are dizziness, sudden trouble walking, or loss of balance. E - Eyes. Signs are trouble seeing or a sudden change in vision. F - Face. Signs are sudden weakness or numbness of the face, or the face or eyelid drooping on one side. A - Arms. Signs are weakness or numbness in an arm. This happens suddenly and usually on one side of the body. S - Speech. Signs are sudden trouble speaking, slurred speech, or trouble understanding what people say. T - Time. Time to call emergency services. Write down what time symptoms started. You or a loved one has other signs of a stroke, such as: A sudden, severe headache with no known cause. Nausea or vomiting. Seizure. These symptoms may represent a serious problem that is an emergency. Do not wait to see if the symptoms will go away. Get medical help right away. Call your local emergency services (911 in the [place] Do not drive yourself to the hospital. Summary You can help to prevent a stroke by eating healthy, exercising, not  smoking, limiting alcohol intake, and managing any medical conditions you may have. Do not use any products that contain nicotine or tobacco. These include cigarettes, chewing tobacco, and vaping devices, such as e-cigarettes. If you need help quitting, ask your health care provider. Remember "BE FAST" for warning signs of a stroke. Get help right away if you or a loved one has any  of these signs. This information is not intended to replace advice given to you by your health care provider. Make sure you discuss any questions you have with your health care provider. Document Revised: 07/20/2019 Document Reviewed: 07/20/2019 Elsevier Patient Education  2022 [employer].

## 2021-04-03 ENCOUNTER — Ambulatory Visit: Payer: Managed Care, Other (non HMO) | Admitting: Cardiology

## 2021-04-03 ENCOUNTER — Encounter: Payer: Self-pay | Admitting: Cardiology

## 2021-04-03 VITALS — BP 120/70 | HR 52 | Ht 69.0 in | Wt 275.0 lb

## 2021-04-03 DIAGNOSIS — I639 Cerebral infarction, unspecified: Secondary | ICD-10-CM

## 2021-04-03 NOTE — Progress Notes (Signed)
? ?Electrophysiology Office Note ? ? ?Date:  04/03/2021  ? ?ID:  Kirk Frazier, DOB [birth year], MRN [id number] ? ?PCP:  Algis Greenhouse, MD  ?Cardiologist:  Bettina Gavia ?Primary Electrophysiologist:  Tawania Daponte Meredith Leeds, MD   ? ?Chief Complaint: CVA ?  ?History of Present Illness: ?Kirk Frazier is a 66 y.o. male who is being seen today for the evaluation of CVA at the request of Bettina Gavia, Kirk Cork, MD. Presenting today for electrophysiology evaluation. ? ?He has a history significant for coronary artery disease status post PCI to the LAD in 2016, hypertension, r hypertension, hyperlipidemia, type 2 diabetes emote stroke with recurrent stroke 12/12/2020.  He was seen at [facility] 01/23/2021 with recurrent stroke symptoms.  He wore a 30-day monitor that showed no evidence of atrial fibrillation. ? ?Today, he denies symptoms of palpitations, chest pain, shortness of breath, orthopnea, PND, lower extremity edema, claudication, dizziness, presyncope, syncope, bleeding, or neurologic sequela. The patient is tolerating medications without difficulties.  He continues to have visual issues in his right eye.  Aside from that, he has recovered from his other strokes. ? ? ?Past Medical History:  ?Diagnosis Date  ? Benign hypertension 02/10/2015  ? CAD in native artery 07/07/2014  ? Overview:  PCI and DES to LCFx 01/08/14 for Troponin  Normal unstable angina  ? Chronic diastolic heart failure ([place]) 07/07/2014  ? Coronary artery disease 02/10/2015  ? Overview:  Managed CARDS 2016: CIRC 95% PCI Resolute stent LAD 40% EF 65%  ? Essential hypertension 07/07/2014  ? Gout 02/10/2015  ? Overview:  2017: sUA=9.6  ? 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 Overview:  1982  ? Hyperlipidemia 07/07/2014  ? Nephrolithiasis 02/10/2015  ? Post-traumatic osteoarthritis of both knees 03/22/2016  ? Rectal bleeding 02/28/2015  ? Rib pain on right side 05/31/2016  ? Overview:  2014: injury  ? Screening for colon cancer 05/30/2016   ? Screening for prostate cancer 05/30/2016  ? Stroke [facility]) 01/23/2021  ? Type 2 diabetes mellitus without complication, without long-term current use of insulin ([place]) 02/10/2015  ? Overview:  2016: 6.5 2017: 412 random with A1C 7.7, started MTF  ? Wellness examination 05/30/2016  ? ?Past Surgical History:  ?Procedure Laterality Date  ? BUBBLE STUDY  01/03/2021  ? Procedure: BUBBLE STUDY;  Surgeon: Thayer Headings, MD;  Location: [facility];  Service: Cardiovascular;;  ? CARPAL TUNNEL RELEASE    ? CORONARY ANGIOPLASTY WITH STENT PLACEMENT    ? KNEE SURGERY    ? TEE WITHOUT CARDIOVERSION N/A 01/03/2021  ? Procedure: TRANSESOPHAGEAL ECHOCARDIOGRAM (TEE);  Surgeon: Acie Fredrickson Wonda Cheng, MD;  Location: [facility];  Service: Cardiovascular;  Laterality: N/A;  ? TRIGGER FINGER RELEASE    ? ? ? ?Current Outpatient Medications  ?Medication Sig Dispense Refill  ? allopurinol (ZYLOPRIM) 300 MG tablet Take 300 mg by mouth daily.    ? atorvastatin (LIPITOR) 80 MG tablet TAKE 1 TABLET BY MOUTH EVERY DAY 90 tablet 1  ? clopidogrel (PLAVIX) 75 MG tablet Take 75 mg by mouth daily.    ? Colchicine 0.6 MG CAPS Take 0.6 mg by mouth daily as needed for other (Gout).    ? ezetimibe (ZETIA) 10 MG tablet Take 10 mg by mouth daily.    ? FARXIGA 10 MG TABS tablet Take 10 mg by mouth every morning.    ? fluorouracil (EFUDEX) 5 % cream Apply 1 application topically daily as needed (Skin rash).    ? Ivermectin 1 % CREA Apply 1  application topically daily as needed (Skin cancers).    ? metFORMIN (GLUCOPHAGE-XR) 500 MG 24 hr tablet Take 1,000 mg by mouth 2 (two) times daily.     ? metoprolol tartrate (LOPRESSOR) 25 MG tablet TAKE 1 TABLET BY MOUTH TWICE A DAY 180 tablet 2  ? nitroGLYCERIN (NITROSTAT) 0.4 MG SL tablet PLACE 1 TABLET (0.4 MG TOTAL) UNDER THE TONGUE EVERY FIVE (5) MINUTES AS NEEDED FOR CHEST PAIN. 30 tablet 3  ? potassium chloride (KLOR-CON) 10 MEQ tablet TAKE 1 TABLET BY MOUTH EVERY DAY 90 tablet 2  ? RYBELSUS 7 MG TABS Take 0.5  tablets by mouth daily.    ? tadalafil (CIALIS) 10 MG tablet Take 10 mg by mouth daily as needed for erectile dysfunction.    ? tamsulosin (FLOMAX) 0.4 MG CAPS capsule Take 0.4 mg by mouth 2 (two) times daily.    ? terbinafine (LAMISIL) 250 MG tablet Take 250 mg by mouth daily.    ? torsemide (DEMADEX) 20 MG tablet TAKE 1 TABLET (20 MG TOTAL) BY MOUTH 2 (TWO) TIMES DAILY. INSUR Moira Umholtz PAY ON 02/21/19 180 tablet 3  ? valACYclovir (VALTREX) 500 MG tablet Take 500 mg by mouth See admin instructions. Take 4 tables by mouth ever 12 hours for 1 day    ? ?No current facility-administered medications for this visit.  ? ? ?Allergies:   Morphine and Pseudoephedrine hcl  ? ?Social History:  The patient  reports that he has quit smoking. He has been exposed to tobacco smoke. He has never used smokeless tobacco. He reports that he does not drink alcohol and does not use drugs.  ? ?Family History:  The patient's family history includes Cancer in his father; Diabetes in his brother and father; Heart attack in his brother; Heart failure in his father and mother; Hypertension in his mother; Thyroid disease in his sister.  ? ? ?ROS:  Please see the history of present illness.   Otherwise, review of systems is positive for none.   All other systems are reviewed and negative.  ? ? ?PHYSICAL EXAM: ?VS:  BP 120/70   Pulse (!) 52   Ht 5\' 9"  (1.753 m)   Wt 275 lb (124.7 kg)   SpO2 99%   BMI 40.61 kg/m?  , BMI Body mass index is 40.61 kg/m?. ?GEN: Well nourished, well developed, in no acute distress  ?HEENT: normal  ?Neck: no JVD, carotid bruits, or masses ?Cardiac: RRR; no murmurs, rubs, or gallops,no edema  ?Respiratory:  clear to auscultation bilaterally, normal work of breathing ?GI: soft, nontender, nondistended, + BS ?MS: no deformity or atrophy  ?Skin: warm and dry ?Neuro:  Strength and sensation are intact ?Psych: euthymic mood, full affect ? ?EKG:  EKG is not ordered today. ?Personal review of the ekg ordered 01/24/21 shows sinus  rhythm ? ?Recent Labs: ?01/23/2021: ALT 18; BUN 21; Creatinine, Ser 1.10; Hemoglobin 16.7; Platelets 196; Potassium 3.9; Sodium 142  ? ? ?Lipid Panel  ?   ?Component Value Date/Time  ? CHOL 116 06/26/2019 0918  ? TRIG 114 06/26/2019 0918  ? HDL 40 06/26/2019 0918  ? CHOLHDL 2.9 06/26/2019 0918  ? LDLCALC 55 06/26/2019 0918  ? ? ? ?Wt Readings from Last 3 Encounters:  ?04/03/21 275 lb (124.7 kg)  ?03/09/21 281 lb (127.5 kg)  ?02/15/21 277 lb 12.8 oz (126 kg)  ?  ? ? ?Other studies Reviewed: ?Additional studies/ records that were reviewed today include: TEE 01/03/21  ?Review of the above records today demonstrates:  ?  1. Left ventricular ejection fraction, by estimation, is 60 to 65%. The  ?left ventricle has normal function.  ? 2. Right ventricular systolic function is normal. The right ventricular  ?size is normal.  ? 3. No left atrial/left atrial appendage thrombus was detected.  ? 4. The mitral valve is normal in structure. No evidence of mitral valve  ?regurgitation.  ? 5. The aortic valve is normal in structure. Aortic valve regurgitation is  ?not visualized. No aortic stenosis is present.  ? 6. Agitated saline contrast bubble study was negative, with no evidence  ?of any interatrial shunt.  ? ?Cardiac monitor 02/24/2021 personally reviewed ?Predominant underlying rhythm was sinus rhythm ?Less than 1% ventricular ectopy burden ?No atrial fibrillation noted ? ?ASSESSMENT AND PLAN: ? ?1.  Cryptogenic stroke: 30-day monitor without evidence of atrial fibrillation.  No causes thus far been found for his stroke.  At this point, further monitoring for atrial fibrillation would be warranted.  Risk and benefits of Linq monitor were discussed.  Risk include bleeding and infection.  He understands these risks and is agreed to the procedure. ? ?2.  Coronary artery disease: No current chest pain.  Continue with current management per primary cardiology. ? ?3.  Hypertension: Currently well controlled ? ? ? ?Current medicines are  reviewed at length with the patient today.   ?The patient does not have concerns regarding his medicines.  The following changes were made today:  none ? ?Labs/ tests ordered today include:  ?No orders of the

## 2021-04-03 NOTE — Patient Instructions (Signed)
?Medication Instructions:  ?Your physician recommends that you continue on your current medications as directed. Please refer to the Current Medication list given to you today. ? ?*If you need a refill on your cardiac medications before your next appointment, please call your pharmacy* ? ? ?Lab Work: ?None ordered ? ? ?Testing/Procedures: ?You had a loop recorder implanted this morning. ? ? ?Follow-Up: ?At [facility], you and your health needs are our priority.  As part of our continuing mission to provide you with exceptional heart care, we have created designated Provider Care Teams.  These Care Teams include your primary Cardiologist (physician) and Advanced Practice Providers (APPs -  Physician Assistants and Nurse Practitioners) who all work together to provide you with the care you need, when you need it. ? ?Your next appointment:   ?As   needed ? ?The format for your next appointment:   ?In Person ? ?Provider:   ?Loman Brooklyn, MD ? ? ? ?Thank you for choosing [facility]!! ? ? ?Dory Horn, RN ?([phone] ? ? ?Other Instructions ? ?Implantable Loop Recorder Placement, Care After ?This sheet gives you information about how to care for yourself after your procedure. Your health care provider may also give you more specific instructions. If you have problems or questions, contact your health care provider. ?What can I expect after the procedure? ?After the procedure, it is common to have: ?Soreness or discomfort near the incision. ?Some swelling or bruising near the incision. ?Follow these instructions at home: ?Incision care ? ?Follow instructions from your health care provider about how to take care of your incision. Make sure you: ?Wash your hands with soap and water before you change your bandage (dressing). If soap and water are not available, use hand sanitizer. ?Change your dressing as told by your health care provider. ?Keep your dressing dry. ?Leave stitches (sutures), skin glue, or  adhesive strips in place. These skin closures may need to stay in place for 2 weeks or longer. If adhesive strip edges start to loosen and curl up, you may trim the loose edges. Do not remove adhesive strips completely unless your health care provider tells you to do that. ?Check your incision area every day for signs of infection. Check for: ?Redness, swelling, or pain. ?Fluid or blood. ?Warmth. ?Pus or a bad smell. ?Do not take baths, swim, or use a hot tub until your health care provider approves. Ask your health care provider if you can take showers. ?Activity ? ?Return to your normal activities as told by your health care provider. Ask your health care provider what activities are safe for you. ?Do not drive for 24 hours if you were given a sedative during your procedure. ?General instructions ?Follow instructions from your health care provider about how to manage your implantable loop recorder and transmit the information. Learn how to activate a recording if this is necessary for your type of device. ?Do not go through a metal detection gate, and do not let someone hold a metal detector over your chest. Show your ID card. ?Do not have an MRI unless you check with your health care provider first. ?Take over-the-counter and prescription medicines only as told by your health care provider. ?Keep all follow-up visits as told by your health care provider. This is important. ?Contact a health care provider if: ?You have redness, swelling, or pain around your incision. ?You have a fever. ?You have pain that is not relieved by your pain medicine. ?You have triggered your device because  of fainting (syncope) or because of a heartbeat that feels like it is racing, slow, fluttering, or skipping (palpitations). ?Get help right away if you have: ?Chest pain. ?Difficulty breathing. ?Summary ?After the procedure, it is common to have soreness or discomfort near the incision. ?Change your dressing as told by your health care  provider. ?Follow instructions from your health care provider about how to manage your implantable loop recorder and transmit the information. ?Keep all follow-up visits as told by your health care provider. This is important. ?This information is not intended to replace advice given to you by your health care provider. Make sure you discuss any questions you have with your health care provider. ?Document Revised: 04/19/2020 Document Reviewed: 04/19/2020 ?Elsevier Patient Education ? 2022 Elsevier Inc[employer] ?  ?

## 2021-05-04 ENCOUNTER — Encounter: Payer: Self-pay | Admitting: Neurology

## 2021-05-04 ENCOUNTER — Ambulatory Visit: Payer: Managed Care, Other (non HMO) | Admitting: Neurology

## 2021-05-04 VITALS — BP 139/79 | HR 53 | Ht 69.0 in | Wt 280.0 lb

## 2021-05-04 DIAGNOSIS — I63411 Cerebral infarction due to embolism of right middle cerebral artery: Secondary | ICD-10-CM | POA: Diagnosis not present

## 2021-05-04 DIAGNOSIS — G4730 Sleep apnea, unspecified: Secondary | ICD-10-CM

## 2021-05-04 DIAGNOSIS — I2782 Chronic pulmonary embolism: Secondary | ICD-10-CM

## 2021-05-04 DIAGNOSIS — G4719 Other hypersomnia: Secondary | ICD-10-CM

## 2021-05-04 DIAGNOSIS — H34231 Retinal artery branch occlusion, right eye: Secondary | ICD-10-CM | POA: Insufficient documentation

## 2021-05-04 DIAGNOSIS — R0683 Snoring: Secondary | ICD-10-CM

## 2021-05-04 DIAGNOSIS — E66813 Obesity, class 3: Secondary | ICD-10-CM

## 2021-05-04 HISTORY — DX: Other hypersomnia: G47.19

## 2021-05-04 HISTORY — DX: Chronic pulmonary embolism: I27.82

## 2021-05-04 HISTORY — DX: Sleep apnea, unspecified: G47.30

## 2021-05-04 HISTORY — DX: Snoring: R06.83

## 2021-05-04 HISTORY — DX: Obesity, class 3: E66.813

## 2021-05-04 HISTORY — DX: Retinal artery branch occlusion, right eye: H34.231

## 2021-05-04 HISTORY — DX: Cerebral infarction due to embolism of right middle cerebral artery: I63.411

## 2021-05-04 NOTE — Patient Instructions (Signed)
Screening for Sleep Apnea  Sleep apnea is a condition in which breathing pauses or becomes shallow during sleep. Sleep apnea screening is a test to determine if you are at risk for sleep apnea. The test includes a series of questions. It will only takes a few minutes. Your health care provider may ask you to have this test in preparation for surgery or as part of a physical exam. What are the symptoms of sleep apnea? Common symptoms of sleep apnea include: Snoring. Waking up often at night. Daytime sleepiness. Pauses in breathing. Choking or gasping during sleep. Irritability. Forgetfulness. Trouble thinking clearly. Depression. Personality changes. Most people with sleep apnea do not know that they have it. What are the advantages of sleep apnea screening? Getting screened for sleep apnea can help: Ensure your safety. It is important for your health care providers to know whether or not you have sleep apnea, especially if you are having surgery or have other long-term (chronic) health conditions. Improve your health and allow you to get a better night's rest. Restful sleep can help you: Have more energy. Lose weight. Improve high blood pressure. Improve diabetes management. Prevent stroke. Prevent car accidents. What happens during the screening? Screening usually includes being asked a list of questions about your sleep quality. Some questions you may be asked include: Do you snore? Is your sleep restless? Do you have daytime sleepiness? Has a partner or spouse told you that you stop breathing during sleep? Have you had trouble concentrating or memory loss? What is your age? What is your neck circumference? To measure your neck, keep your back straight and gently wrap the tape measure around your neck. Put the tape measure at the middle of your neck, between your chin and collarbone. What is your sex assigned at birth? Do you have or are you being treated for high blood  pressure? If your screening test is positive, you are at risk for the condition. Further testing may be needed to confirm a diagnosis of sleep apnea. Where to find more information You can find screening tools online or at your health care clinic. For more information about sleep apnea screening and healthy sleep, visit these websites: Centers for Disease Control and Prevention: www.cdc.gov American Sleep Apnea Association: www.sleepapnea.org Contact a health care provider if: You think that you may have sleep apnea. Summary Sleep apnea screening can help determine if you are at risk for sleep apnea. It is important for your health care providers to know whether or not you have sleep apnea, especially if you are having surgery or have other chronic health conditions. You may be asked to take a screening test for sleep apnea in preparation for surgery or as part of a physical exam. This information is not intended to replace advice given to you by your health care provider. Make sure you discuss any questions you have with your health care provider. Document Revised: 11/27/2019 Document Reviewed: 11/27/2019 Elsevier Patient Education  2023 [employer]  

## 2021-05-04 NOTE — Progress Notes (Signed)
? ? ? ?SLEEP MEDICINE CLINIC ?  ? ?Provider:  Larey Seat, MD  ?Primary Care Physician:  Algis Greenhouse, MD ?[address] ?[place] [address]  ? ?  ?Referring Provider: Garvin Fila, Md ?[address] ?[address] ?[place],  [place] [address]  ?  ?  ?    ?Chief Complaint according to patient   ?Patient presents with:  ?  ? New Patient (Initial Visit)  ?   Rm 10 with wife. Here for sleep consult no prior sleep study. Had stroke in January was recommended to have sleep work up to rule out OSA. Snoring is present at night.   ?  ?  ?HISTORY OF PRESENT ILLNESS:  ?05-04-2021:  ?Kirk Frazier is a 66 y.o. Caucasian male patient seen here upon Dr. Clydene Fake referral on 05/04/2021 for a Sleep consultation. 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  ?  ? 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 ?  ?The patient had the last admission for Left sided numbness. MRI confirmed 'embolic stroke'   Comes in today with L sided numbness. Symptoms improved.  ?MRI confirmed a new embolic stroke. Will discuss with Neuro on the next best steps. ?  ? Date: 01/24/2021 ? Rate: 58 ? Rhythm: sinus rhythm, bradycardia ? QRS Axis: normal ? Intervals: normal ? ST/T Wave abnormalities:  normal ? Conduction Disutrbances: none ? ? ?Sleep relevant medical history: "I usually sleep through the night". ?Snoring, Sleep walking in his youth, no ENT surgery, no cervical spine surgery, he lost muscles in both legs.   ? Family medical /sleep history: Father was on CPAP with OSA, he died at age 27 in 2021/05/05, had a pacemaker, falls, mother passed in May 05, 2020, he is one of 5 sons. ?He had multiple knee injuries, at age 50-18, surgeries throughout his life.  ? ?  ?Social history:  Patient is working as Technical sales engineer and lives in a household  in [place], [place] spouse and granddaughter age, 100. Marland Kitchen Pets are ** present. ?Tobacco use quit in 05/06/1990.  ETOH use - quit 05-06-90,  ?Caffeine intake in form of Coffee( 2-4 cups at breakfast on weekends ) Soda( 1/ w) . ?Regular exercise in form of ; limited.   ?Hobbies :yard work.  ? ?  ?  ?Sleep habits are as follows: The patient's dinner time is between 6-9  PM. The patient goes to bed at 9.30 PM and continues to sleep for a minimum  7 hours. ?The preferred sleep position is right sided, with the support of 1- flat bed,  pillows.  ?Dreams are reportedly frequent/vivid.  ?8-10  AM is the usual rise time. The patient wakes up spontaneously around 7.  ?He reports feeling refreshed or restored in AM, with symptoms such as dry mouth, ?. Naps are taken frequently, lasting from 15 to 60 minutes in front of the TV and are refreshing for a limited time. ?  ?Review of Systems: ?Out of  a complete 14 system review, the patient complains of only the following symptoms, and all other reviewed systems are negative.:  ?Fatigue, sleepiness , loud snoring, fragmented sleep, Insomnia  ? ?Driving and getting sleepy.  ?Right hemispheric stroke, arm and face.  ?  ?How likely are you to doze in the following situations: ?0 = not likely, 1 = slight chance, 2 = moderate chance, 3 = high chance ?  ?Sitting and Reading? ?Watching Television? ?Sitting inactive in a public place (theater or  meeting)? ?As a passenger in a car for an hour without a break? ?Lying down in the afternoon when circumstances permit? ?Sitting and talking to someone? ?Sitting quietly after lunch without alcohol? ?In a car, while stopped for a few minutes in traffic? ?  ?Total = 16/ 24 points  ? FSS endorsed at 53/ 63 points.  ? ?Social History  ? ?Socioeconomic History  ? Marital status: Married  ?  Spouse name: Not on file  ? Number of children: Not on file  ? Years of education: Not on file  ? Highest education level: Associate degree: academic program  ?Occupational History  ? Not on file  ?Tobacco Use  ? Smoking status: Former  ?  Passive exposure: Past  ? Smokeless tobacco: Never  ?Vaping Use  ? Vaping Use: Never used  ?Substance and Sexual Activity  ? Alcohol use: No  ? Drug use: No  ? Sexual activity: Not on file  ?Other Topics Concern  ? Not on file  ?Social History Narrative  ? Right handed   ? Caffeine- 2 cups of coffee per day   ? Live at home with wife   ? ?Social Determinants of Health  ? ?Financial Resource Strain: Not on file  ?Food Insecurity: Not on file  ?Transportation Needs: Not on file  ?Physical Activity: Not on file  ?Stress: Not on file  ?Social Connections: Not on file  ? ? ?Family History  ?Problem Relation Age of Onset  ? Hypertension Mother   ? Heart failure Mother   ? Cancer Father   ? Heart failure Father   ? Diabetes Father   ? Thyroid disease Sister   ? Heart attack Brother   ? Diabetes Brother   ? ? ?Past Medical History:  ?Diagnosis Date  ? Benign hypertension 02/10/2015  ? CAD in native artery 07/07/2014  ? Overview:  PCI and DES to LCFx 01/08/14 for Troponin  Normal unstable angina  ? Chronic diastolic heart failure ([place]) 07/07/2014  ? Coronary artery disease 02/10/2015  ? Overview:  Managed CARDS 2016: CIRC 95% PCI Resolute stent LAD 40% EF 65%  ? Essential hypertension 07/07/2014  ? Gout 02/10/2015  ? Overview:  2017: sUA=9.6  ? 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 Overview:   1982  ? Hyperlipidemia 07/07/2014  ? Nephrolithiasis 02/10/2015  ? Post-traumatic osteoarthritis of both knees 03/22/2016  ? Rectal bleeding 02/28/2015  ? Rib pain on right side 05/31/2016  ? Overview:  2014: injury  ? Screening for colon cancer 05/30/2016  ? Screening for prostate cancer 05/30/2016  ? Stroke [facility]), embolic, right brain- homonymous hemianopsia.  01/23/2021  ? Type 2 diabetes mellitus without complication, without long-term current use of insulin ([place]) 02/10/2015  ? Overview:  2016: 6.5 2017: 412 random with A1C 7.7, started MTF  ? Wellness examination 05/30/2016  ? ? ?Past Surgical History:  ?Procedure Laterality Date  ? BUBBLE STUDY  01/03/2021  ? Procedure: BUBBLE STUDY;  Surgeon: Thayer Headings,  MD;  Location: [facility];  Service: Cardiovascular;;  ? CARPAL TUNNEL RELEASE    ? CORONARY ANGIOPLASTY WITH STENT PLACEMENT    ? KNEE SURGERY    ? TEE WITHOUT CARDIOVERSION N/A 01/03/2021  ? Procedure: TRANSESOPHAGEAL ECHOCARDIOGRAM (TEE);  Surgeon: Acie Fredrickson Wonda Cheng, MD;  Location: [facility];  Service: Cardiovascular;  Laterality: N/A;  ? TRIGGER FINGER RELEASE    ?  ? ?Current Outpatient Medications on File Prior to Visit  ?Medication Sig Dispense Refill  ? allopurinol (ZYLOPRIM) 300 MG tablet Take 300 mg by mouth daily.    ? atorvastatin (LIPITOR) 80 MG tablet TAKE 1 TABLET BY MOUTH EVERY DAY 90 tablet 1  ? clopidogrel (PLAVIX) 75 MG tablet Take 75 mg by mouth daily.    ? Colchicine 0.6 MG CAPS Take 0.6 mg by mouth daily as needed for other (Gout).    ? ezetimibe (ZETIA) 10 MG tablet Take 10 mg by mouth daily.    ? FARXIGA 10 MG TABS tablet Take 10 mg by mouth every morning.    ? fluorouracil (EFUDEX) 5 % cream Apply 1 application topically daily as needed (Skin rash).    ? Ivermectin 1 % CREA Apply 1 application topically daily as needed (Skin cancers).    ? metFORMIN (GLUCOPHAGE-XR) 500 MG 24 hr tablet Take 1,000 mg by mouth 2 (two) times daily.     ? metoprolol tartrate (LOPRESSOR) 25 MG tablet  TAKE 1 TABLET BY MOUTH TWICE A DAY 180 tablet 2  ? nitroGLYCERIN (NITROSTAT) 0.4 MG SL tablet PLACE 1 TABLET (0.4 MG TOTAL) UNDER THE TONGUE EVERY FIVE (5) MINUTES AS NEEDED FOR CHEST PAIN. 30 tablet 3  ? po

## 2021-05-09 ENCOUNTER — Ambulatory Visit (INDEPENDENT_AMBULATORY_CARE_PROVIDER_SITE_OTHER): Payer: Managed Care, Other (non HMO)

## 2021-05-09 DIAGNOSIS — I639 Cerebral infarction, unspecified: Secondary | ICD-10-CM | POA: Diagnosis not present

## 2021-05-10 LAB — CUP PACEART REMOTE DEVICE CHECK
Date Time Interrogation Session: 20230509211517
Implantable Pulse Generator Implant Date: 20230403

## 2021-05-13 NOTE — Progress Notes (Signed)
?Cardiology Office Note:   ? ?Date:  05/15/2021  ? ?ID:  Kirk Frazier, DOB May 18, 1955, MRN [id number] ? ?PCP:  Algis Greenhouse, MD  ?Cardiologist:  Shirlee More, MD   ? ?Referring MD: Algis Greenhouse, MD  ? ? ?ASSESSMENT:   ? ?1. Cryptogenic stroke ([place])   ?2. Hypertensive heart disease with chronic diastolic congestive heart failure ([place])   ?3. Coronary artery disease with stable angina pectoris, unspecified vessel or lesion type, unspecified whether native or transplanted heart ([place])   ?4. Hyperlipidemia, unspecified hyperlipidemia type   ? ?PLAN:   ? ?In order of problems listed above: ? ?He has an implanted loop recorder he has had no documented atrial fibrillation continue single antiplatelet agent clopidogrel.  If A-fib documented would benefit from anticoagulation ?Stable continue his current loop diuretic and antihypertensive agents including beta-blocker ?Stable CAD having no angina after PCI continue medical treatment ?Lipids at target continue his high intensity statin and Zetia ?Stable diabetes continue current treatment through his PCP ? ? ?Next appointment: 6 months ? ? ?Medication Adjustments/Labs and Tests Ordered: ?Current medicines are reviewed at length with the patient today.  Concerns regarding medicines are outlined above.  ?Orders Placed This Encounter  ?Procedures  ? Basic Metabolic Panel (BMET)  ? Pro b natriuretic peptide  ? ?No orders of the defined types were placed in this encounter. ? ? ?Chief Complaint  ?Patient presents with  ? Follow-up  ?  After ILR with recurrent stroke  ? ? ?History of Present Illness:   ? ?Kirk Frazier is a 66 y.o. male with a hx of CAD with PCI and stent to LAD 2016 hypertensive heart disease and chronic diastolic heart failure and recurrent stroke last seen 12/14/2020 after [facility] admission.He was recently admitted to [facility] 12/12/2020 with a discharge diagnosis of history of stroke strokelike symptoms hypertension he was seen by  teleneurology cardiology was not consulted.  His symptoms included a visual cut in the right eye left upper extremity numbness.  MRI is described as showing multiple small infarcts within the right frontal parietal region they did an echocardiogram at the hospital that was described as showing no cardiac source of stroke and he was discharged from the hospital on aspirin clopidogrel with a recommendation to wear a 30-day ambulatory monitor.  Is also advised to get an MRI and follow-up 4 to 6 weeks and was referred to ophthalmology.  Laboratory studies showed a hemoglobin of 14.9 creatinine 1.2 LDL cholesterol 54 has not specifically described that he was placed on telemetry I suspect he was his EKG showed sinus rhythm 59 bpm first-degree AV block.  His echocardiogram is described as technically difficult with suboptimal views he did not have a shunt with agitated contrast injection.  He was referred for an implanted loop recorder. ?Compliance with diet, lifestyle and medications: yes ? ?His last device check 05/09/2021 showed normal function and no episodes of atrial fibrillation. ?He had a repeat transesophageal echocardiogram 01/03/2021 showing no finding of atrial thrombus and no shunt with agitated saline contrast injection. ? ?He is now taking a single antiplatelet agent clopidogrel ?He has had no documented atrial fibrillation or flutter no recurrent neurologic event ?Severely limited by knee pain contemplating bilateral total knee arthroplasty ?No angina edema shortness of breath palpitation or syncope ?Past Medical History:  ?Diagnosis Date  ? Benign hypertension 02/10/2015  ? CAD in native artery 07/07/2014  ? Overview:  PCI and DES to LCFx 01/08/14 for Troponin  Normal unstable angina  ?  Chronic diastolic heart failure ([place]) 07/07/2014  ? Coronary artery disease 02/10/2015  ? Overview:  Managed CARDS 2016: CIRC 95% PCI Resolute stent LAD 40% EF 65%  ? Essential hypertension 07/07/2014  ? Gout 02/10/2015  ?  Overview:  2017: sUA=9.6  ? 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 Overview:  1982  ? Hyperlipidemia 07/07/2014  ? Nephrolithiasis 02/10/2015  ? Post-traumatic osteoarthritis of both knees 03/22/2016  ? Rectal bleeding 02/28/2015  ? Rib pain on right side 05/31/2016  ? Overview:  2014: injury  ? Screening for colon cancer 05/30/2016  ? Screening for prostate cancer 05/30/2016  ? Stroke [facility]) 01/23/2021  ? Type 2 diabetes mellitus without complication, without long-term current use of insulin ([place]) 02/10/2015  ? Overview:  2016: 6.5 2017: 412 random with A1C 7.7, started MTF  ? Wellness examination 05/30/2016  ? ? ?Past Surgical History:  ?Procedure Laterality Date  ? BUBBLE STUDY  01/03/2021  ? Procedure: BUBBLE STUDY;  Surgeon: Thayer Headings, MD;  Location: [place];  Service: Cardiovascular;;  ? CARPAL TUNNEL RELEASE    ? CORONARY ANGIOPLASTY WITH STENT PLACEMENT    ? KNEE SURGERY    ? TEE WITHOUT CARDIOVERSION N/A 01/03/2021  ? Procedure: TRANSESOPHAGEAL ECHOCARDIOGRAM (TEE);  Surgeon: Acie Fredrickson Wonda Cheng, MD;  Location: [place];  Service: Cardiovascular;  Laterality: N/A;  ? TRIGGER FINGER RELEASE    ? ? ?Current Medications: ?Current Meds  ?Medication Sig  ? allopurinol (ZYLOPRIM) 300 MG tablet Take 300 mg by mouth daily.  ? atorvastatin (LIPITOR) 80 MG tablet TAKE 1 TABLET BY MOUTH EVERY DAY  ? clopidogrel (PLAVIX) 75 MG tablet Take 75 mg by mouth daily.  ? Colchicine 0.6 MG CAPS Take 0.6 mg by mouth daily as needed for other (Gout).  ? ezetimibe (ZETIA) 10 MG tablet Take 10 mg by mouth daily.  ? FARXIGA 10 MG TABS tablet Take 10 mg by mouth every morning.  ? fluorouracil (EFUDEX) 5 % cream Apply 1 application topically daily as needed (Skin rash).  ? Ivermectin 1 % CREA Apply 1 application topically daily as needed (Skin cancers).  ? metFORMIN (GLUCOPHAGE-XR) 500 MG 24 hr tablet Take 1,000 mg by mouth 2 (two) times daily.   ? metoprolol tartrate (LOPRESSOR) 25 MG tablet TAKE 1 TABLET BY  MOUTH TWICE A DAY  ? nitroGLYCERIN (NITROSTAT) 0.4 MG SL tablet PLACE 1 TABLET (0.4 MG TOTAL) UNDER THE TONGUE EVERY FIVE (5) MINUTES AS NEEDED FOR CHEST PAIN.  ? potassium chloride (KLOR-CON) 10 MEQ tablet TAKE 1 TABLET BY MOUTH EVERY DAY  ? RYBELSUS 7 MG TABS Take 0.5 tablets by mouth daily.  ? tadalafil (CIALIS) 10 MG tablet Take 10 mg by mouth daily as needed for erectile dysfunction.  ? tamsulosin (FLOMAX) 0.4 MG CAPS capsule Take 0.4 mg by mouth 2 (two) times daily.  ? terbinafine (LAMISIL) 250 MG tablet Take 250 mg by mouth daily.  ? torsemide (DEMADEX) 20 MG tablet TAKE 1 TABLET (20 MG TOTAL) BY MOUTH 2 (TWO) TIMES DAILY. INSUR WILL PAY ON 02/21/19  ? valACYclovir (VALTREX) 500 MG tablet Take 500 mg by mouth See admin instructions. Take 4 tables by mouth ever 12 hours for 1 day  ?  ? ?Allergies:   Morphine and Pseudoephedrine hcl  ? ?Social History  ? ?Socioeconomic History  ? Marital status: Married  ?  Spouse name: Not on file  ? Number of children: Not on file  ? Years of education: Not on file  ?  Highest education level: Associate degree: academic program  ?Occupational History  ? Not on file  ?Tobacco Use  ? Smoking status: Former  ?  Passive exposure: Past  ? Smokeless tobacco: Never  ?Vaping Use  ? Vaping Use: Never used  ?Substance and Sexual Activity  ? Alcohol use: No  ? Drug use: No  ? Sexual activity: Not on file  ?Other Topics Concern  ? Not on file  ?Social History Narrative  ? Right handed   ? Caffeine- 2 cups of coffee per day   ? Live at home with wife   ? ?Social Determinants of Health  ? ?Financial Resource Strain: Not on file  ?Food Insecurity: Not on file  ?Transportation Needs: Not on file  ?Physical Activity: Not on file  ?Stress: Not on file  ?Social Connections: Not on file  ?  ? ?Family History: ?The patient's family history includes Cancer in his father; Diabetes in his brother and father; Heart attack in his brother; Heart failure in his father and mother; Hypertension in his  mother; Thyroid disease in his sister. ?ROS:   ?Please see the history of present illness.    ?All other systems reviewed and are negative. ? ?EKGs/Labs/Other Studies Reviewed:   ? ?The following studies were rev

## 2021-05-15 ENCOUNTER — Ambulatory Visit: Payer: Managed Care, Other (non HMO) | Admitting: Cardiology

## 2021-05-15 ENCOUNTER — Encounter: Payer: Self-pay | Admitting: Cardiology

## 2021-05-15 VITALS — BP 136/74 | HR 60 | Ht 69.0 in | Wt 285.0 lb

## 2021-05-15 DIAGNOSIS — E785 Hyperlipidemia, unspecified: Secondary | ICD-10-CM

## 2021-05-15 DIAGNOSIS — I110 Hypertensive heart disease with heart failure: Secondary | ICD-10-CM | POA: Diagnosis not present

## 2021-05-15 DIAGNOSIS — I639 Cerebral infarction, unspecified: Secondary | ICD-10-CM | POA: Diagnosis not present

## 2021-05-15 DIAGNOSIS — I25118 Atherosclerotic heart disease of native coronary artery with other forms of angina pectoris: Secondary | ICD-10-CM

## 2021-05-15 DIAGNOSIS — I5032 Chronic diastolic (congestive) heart failure: Secondary | ICD-10-CM

## 2021-05-15 NOTE — Patient Instructions (Signed)
Medication Instructions:  Your physician recommends that you continue on your current medications as directed. Please refer to the Current Medication list given to you today.  *If you need a refill on your cardiac medications before your next appointment, please call your pharmacy*   Lab Work: Your physician recommends that you return for lab work in:   Labs today: BMP, Pro BNP  If you have labs (blood work) drawn today and your tests are completely normal, you will receive your results only by: MyChart Message (if you have MyChart) OR A paper copy in the mail If you have any lab test that is abnormal or we need to change your treatment, we will call you to review the results.   Testing/Procedures: None   Follow-Up: At [facility], you and your health needs are our priority.  As part of our continuing mission to provide you with exceptional heart care, we have created designated Provider Care Teams.  These Care Teams include your primary Cardiologist (physician) and Advanced Practice Providers (APPs -  Physician Assistants and Nurse Practitioners) who all work together to provide you with the care you need, when you need it.  We recommend signing up for the patient portal called "MyChart".  Sign up information is provided on this After Visit Summary.  MyChart is used to connect with patients for Virtual Visits (Telemedicine).  Patients are able to view lab/test results, encounter notes, upcoming appointments, etc.  Non-urgent messages can be sent to your provider as well.   To learn more about what you can do with MyChart, go to [email or web address].    Your next appointment:   6 month(s)  The format for your next appointment:   In Person  Provider:   Brian Munley, MD    Other Instructions None  Important Information About Sugar       

## 2021-05-16 ENCOUNTER — Telehealth: Payer: Self-pay

## 2021-05-16 LAB — BASIC METABOLIC PANEL
BUN/Creatinine Ratio: 19 (ref 10–24)
BUN: 19 mg/dL (ref 8–27)
CO2: 23 mmol/L (ref 20–29)
Calcium: 9 mg/dL (ref 8.6–10.2)
Chloride: 106 mmol/L (ref 96–106)
Creatinine, Ser: 1.02 mg/dL (ref 0.76–1.27)
Glucose: 160 mg/dL — ABNORMAL HIGH (ref 70–99)
Potassium: 4.4 mmol/L (ref 3.5–5.2)
Sodium: 144 mmol/L (ref 134–144)
eGFR: 82 mL/min/{1.73_m2} (ref >59)

## 2021-05-16 LAB — PRO B NATRIURETIC PEPTIDE: NT-Pro BNP: 76 pg/mL (ref 0–376)

## 2021-05-16 NOTE — Telephone Encounter (Signed)
Patient notified of results.

## 2021-05-16 NOTE — Telephone Encounter (Signed)
-----   Message from Baldo Daub, MD sent at 05/16/2021  7:29 AM EDT ----- ?Normal or stable result ? ?No changes ?

## 2021-05-18 ENCOUNTER — Telehealth: Payer: Self-pay

## 2021-05-18 NOTE — Telephone Encounter (Signed)
   Pre-operative Risk Assessment    Patient Name: Kirk Frazier  DOB: [birth year] MRN: [id number]      Request for Surgical Clearance    Procedure:   TKA  Date of Surgery:  Clearance TBD                                 Surgeon:  Dr. Georgena Spurling, MD Surgeon's Group or Practice Name:  [facility] Phone number:  [phone] Fax number:  [phone]   Type of Clearance Requested:   - Medical    Type of Anesthesia:  Spinal   Additional requests/questions:  None  Signed, Viviano Simas   05/18/2021, 10:19 AM

## 2021-05-19 NOTE — Telephone Encounter (Signed)
   Patient Name: Kirk Frazier  DOB: [birth year] MRN: [id number]  Primary Cardiologist: Dr. Dulce Sellar  Chart reviewed as part of pre-operative protocol coverage. Dr. Dulce Sellar has reviewed request and feels patient may proceed with surgery as requested without further cardiac testing. I did also personally review with him via secure chat that he is OK with the patient holding Plavix from a cardiac standpoint since the patient is now on Plavix monotherapy and he feels the patient can hold this. We do recommend surgical team reach out to primary care/neurology given patient's recent stroke to clear to hold from their standpoint as well. Will route this bundled recommendation to requesting provider via Epic fax function. Please call with questions.  Laurann Montana, PA-C 05/19/2021, 12:11 PM

## 2021-05-19 NOTE — Telephone Encounter (Signed)
   Patient Name: Kirk Frazier  DOB: December 22, 1955 MRN: [id number]  Primary Cardiologist: Dr. Bettina Gavia  Chart reviewed as part of pre-operative protocol coverage. Dr. Bettina Gavia, you just saw patient a few days ago in clinic and we are now requested for surgical clearance for knee replacement. Given recent OV, too soon for [place] to do a repeat virtual visit. He had cryptogenic stroke in 01/2021 and just had a loop recorder placed by EP a few weeks ago. We will be deferring to his PCP regarding holding of Plavix before surgery, but are you OK with the patient being medically cleared from cardiac standpoint for surgery given recent OV/stroke? - Please route response to P CV DIV PREOP (the pre-op pool). Thank you.   Charlie Pitter, PA-C 05/19/2021, 9:17 AM

## 2021-05-22 ENCOUNTER — Telehealth: Payer: Self-pay

## 2021-05-22 NOTE — Telephone Encounter (Signed)
New onset AF   AF episode 3hrs16min,

## 2021-05-22 NOTE — Progress Notes (Signed)
Carelink Summary Report / Loop Recorder 

## 2021-05-22 NOTE — Telephone Encounter (Signed)
Spoke with patient, informed him of loop recorder findings patient agreeable to apt with AF clinic 05/23/21 at 1:30 directions and number to AF clinic given.

## 2021-05-23 ENCOUNTER — Ambulatory Visit (HOSPITAL_COMMUNITY): Payer: Managed Care, Other (non HMO) | Admitting: Nurse Practitioner

## 2021-05-23 ENCOUNTER — Ambulatory Visit: Payer: Managed Care, Other (non HMO) | Admitting: Cardiology

## 2021-05-24 ENCOUNTER — Ambulatory Visit (HOSPITAL_COMMUNITY)
Admission: RE | Admit: 2021-05-24 | Discharge: 2021-05-24 | Disposition: A | Discharge status: Home / Self Care | Payer: Managed Care, Other (non HMO) | Source: Ambulatory Visit | Attending: Nurse Practitioner | Admitting: Nurse Practitioner

## 2021-05-24 ENCOUNTER — Encounter (HOSPITAL_COMMUNITY): Payer: Self-pay | Admitting: Physician Assistant

## 2021-05-24 VITALS — BP 140/88 | HR 56 | Ht 69.0 in | Wt 278.2 lb

## 2021-05-24 DIAGNOSIS — Z6841 Body Mass Index (BMI) 40.0 and over, adult: Secondary | ICD-10-CM | POA: Insufficient documentation

## 2021-05-24 DIAGNOSIS — I10 Essential (primary) hypertension: Secondary | ICD-10-CM | POA: Insufficient documentation

## 2021-05-24 DIAGNOSIS — E669 Obesity, unspecified: Secondary | ICD-10-CM | POA: Diagnosis not present

## 2021-05-24 DIAGNOSIS — Z7901 Long term (current) use of anticoagulants: Secondary | ICD-10-CM | POA: Diagnosis not present

## 2021-05-24 DIAGNOSIS — I480 Paroxysmal atrial fibrillation: Secondary | ICD-10-CM | POA: Insufficient documentation

## 2021-05-24 DIAGNOSIS — I5032 Chronic diastolic (congestive) heart failure: Secondary | ICD-10-CM

## 2021-05-24 DIAGNOSIS — R0683 Snoring: Secondary | ICD-10-CM | POA: Insufficient documentation

## 2021-05-24 DIAGNOSIS — I639 Cerebral infarction, unspecified: Secondary | ICD-10-CM | POA: Diagnosis not present

## 2021-05-24 DIAGNOSIS — E785 Hyperlipidemia, unspecified: Secondary | ICD-10-CM | POA: Insufficient documentation

## 2021-05-24 DIAGNOSIS — I2510 Atherosclerotic heart disease of native coronary artery without angina pectoris: Secondary | ICD-10-CM | POA: Insufficient documentation

## 2021-05-24 DIAGNOSIS — E119 Type 2 diabetes mellitus without complications: Secondary | ICD-10-CM | POA: Insufficient documentation

## 2021-05-24 DIAGNOSIS — D6869 Other thrombophilia: Secondary | ICD-10-CM | POA: Diagnosis not present

## 2021-05-24 HISTORY — DX: Other thrombophilia: D68.69

## 2021-05-24 HISTORY — DX: Paroxysmal atrial fibrillation: I48.0

## 2021-05-24 MED ORDER — APIXABAN 5 MG PO TABS: 5.0000 mg | ORAL_TABLET | Freq: Two times a day (BID) | ORAL | 3 refills | Status: DC

## 2021-05-24 NOTE — Patient Instructions (Signed)
Start Eliquis 5mg  twice a day Continue Plavix until notified otherwise

## 2021-05-24 NOTE — Progress Notes (Signed)
Primary Care Physician: Olive Bassough, Robert L, MD Primary Cardiologist: Dr Dulce SellarMunley Primary Electrophysiologist: Dr Elberta Fortisamnitz Referring Physician: Dr Kathrine Haddockamnitz   Kirk Frazier is a 66 y.o. male with a history of CAD, HTN, HLD, DM, CVA, atrial fibrillation who presents for consultation in the [facility]. He was seen at [facility] 01/23/2021 with recurrent stroke symptoms. He wore a 30-day monitor that showed no evidence of atrial fibrillation. He was seen by Dr Elberta Fortisamnitz on 04/03/21 and an ILR was placed for long term monitoring. The device clinic received an alert on 05/22/21 for an afib episode lasting 3 hours and 20 minutes. Patient has a CHADS2VASC score of 6. He was asymptomatic during the episode. He denies alcohol use but does snore. He has an appointment with neurology to discuss sleep study.   Today, he denies symptoms of palpitations, chest pain, shortness of breath, orthopnea, PND, lower extremity edema, dizziness, presyncope, syncope, bleeding, or neurologic sequela. The patient is tolerating medications without difficulties and is otherwise without complaint today.    Atrial Fibrillation Risk Factors:  he does have symptoms or diagnosis of sleep apnea. he is scheduled to discuss sleep study with neurology.  he does not have a history of rheumatic fever. he does not have a history of alcohol use. The patient does have a history of early familial atrial fibrillation or other arrhythmias. Father had afib.  he has a BMI of Body mass index is 41.08 kg/m.Marland Kitchen. Filed Weights   05/24/21 1432  Weight: 126.2 kg    Family History  Problem Relation Age of Onset   Hypertension Mother    Heart failure Mother    Cancer Father    Heart failure Father    Diabetes Father    Thyroid disease Sister    Heart attack Brother    Diabetes Brother      Atrial Fibrillation Management history:  Previous antiarrhythmic drugs: none Previous cardioversions:  none Previous ablations: none CHADS2VASC score: 6 Anticoagulation history: none   Past Medical History:  Diagnosis Date   Benign hypertension 02/10/2015   CAD in native artery 07/07/2014   Overview:  PCI and DES to LCFx 01/08/14 for Troponin  Normal unstable angina   Chronic diastolic heart failure (HCC) 07/07/2014   Coronary artery disease 02/10/2015   Overview:  Managed CARDS 2016: CIRC 95% PCI Resolute stent LAD 40% EF 65%   Essential hypertension 07/07/2014   Gout 02/10/2015   Overview:  2017: sUA=9.6   Hemoglobinemia due to blood transfusion 05/31/2016   Overview:  1982   Hyperlipidemia 07/07/2014   Nephrolithiasis 02/10/2015   Post-traumatic osteoarthritis of both knees 03/22/2016   Rectal bleeding 02/28/2015   Rib pain on right side 05/31/2016   Overview:  2014: injury   Screening for colon cancer 05/30/2016   Screening for prostate cancer 05/30/2016   Stroke (HCC) 01/23/2021   Type 2 diabetes mellitus without complication, without long-term current use of insulin (HCC) 02/10/2015   Overview:  2016: 6.5 2017: 412 random with A1C 7.7, started MTF   Wellness examination 05/30/2016   Past Surgical History:  Procedure Laterality Date   BUBBLE STUDY  01/03/2021   Procedure: BUBBLE STUDY;  Surgeon: Vesta MixerNahser, Philip J, MD;  [facility];  Service: Cardiovascular;;   CARPAL TUNNEL RELEASE     CORONARY ANGIOPLASTY WITH STENT PLACEMENT     KNEE SURGERY     TEE WITHOUT CARDIOVERSION N/A 01/03/2021   Procedure: TRANSESOPHAGEAL ECHOCARDIOGRAM (TEE);  Surgeon: Vesta MixerNahser, Philip J,  MD;  Location: [facility] ENDOSCOPY;  Service: Cardiovascular;  Laterality: N/A;   TRIGGER FINGER RELEASE      Current Outpatient Medications  Medication Sig Dispense Refill   allopurinol (ZYLOPRIM) 300 MG tablet Take 300 mg by mouth daily.     atorvastatin (LIPITOR) 80 MG tablet TAKE 1 TABLET BY MOUTH EVERY DAY 90 tablet 1   clopidogrel (PLAVIX) 75 MG tablet Take 75 mg by mouth daily.     Colchicine 0.6  MG CAPS Take 0.6 mg by mouth daily as needed for other (Gout).     ezetimibe (ZETIA) 10 MG tablet Take 10 mg by mouth daily.     FARXIGA 10 MG TABS tablet Take 10 mg by mouth every morning.     fluorouracil (EFUDEX) 5 % cream Apply 1 application topically daily as needed (Skin rash).     Ivermectin 1 % CREA Apply 1 application topically daily as needed (Skin cancers).     metFORMIN (GLUCOPHAGE-XR) 500 MG 24 hr tablet Take 1,000 mg by mouth 2 (two) times daily.      metoprolol tartrate (LOPRESSOR) 25 MG tablet TAKE 1 TABLET BY MOUTH TWICE A DAY 180 tablet 2   nitroGLYCERIN (NITROSTAT) 0.4 MG SL tablet PLACE 1 TABLET (0.4 MG TOTAL) UNDER THE TONGUE EVERY FIVE (5) MINUTES AS NEEDED FOR CHEST PAIN. 30 tablet 3   potassium chloride (KLOR-CON) 10 MEQ tablet TAKE 1 TABLET BY MOUTH EVERY DAY 90 tablet 2   RYBELSUS 7 MG TABS Take 0.5 tablets by mouth daily.     tadalafil (CIALIS) 10 MG tablet Take 10 mg by mouth daily as needed for erectile dysfunction.     tamsulosin (FLOMAX) 0.4 MG CAPS capsule Take 0.4 mg by mouth 2 (two) times daily.     terbinafine (LAMISIL) 250 MG tablet Take 250 mg by mouth daily.     torsemide (DEMADEX) 20 MG tablet TAKE 1 TABLET (20 MG TOTAL) BY MOUTH 2 (TWO) TIMES DAILY. INSUR WILL PAY ON 02/21/19 180 tablet 3   valACYclovir (VALTREX) 500 MG tablet Take 500 mg by mouth See admin instructions. Take 4 tables by mouth ever 12 hours for 1 day     No current facility-administered medications for this encounter.    Allergies  Allergen Reactions   Morphine Nausea And Vomiting   Pseudoephedrine Hcl Rash    Social History   Socioeconomic History   Marital status: Married    Spouse name: Not on file   Number of children: Not on file   Years of education: Not on file   Highest education level: Associate degree: academic program  Occupational History   Not on file  Tobacco Use   Smoking status: Former    Passive exposure: Past   Smokeless tobacco: Never   Tobacco comments:     Former smoker 05/24/21  Vaping Use   Vaping Use: Never used  Substance and Sexual Activity   Alcohol use: No   Drug use: No   Sexual activity: Not on file  Other Topics Concern   Not on file  Social History Narrative   Right handed    Caffeine- 2 cups of coffee per day    Live at home with wife    Social Determinants of Health   Financial Resource Strain: Not on file  Food Insecurity: Not on file  Transportation Needs: Not on file  Physical Activity: Not on file  Stress: Not on file  Social Connections: Not on file  Intimate Partner Violence: Not on  file     ROS- All systems are reviewed and negative except as per the HPI above.  Physical Exam: Vitals:   05/24/21 1432  BP: 140/88  Pulse: (!) 56  Weight: 126.2 kg  Height: 5\' 9"  (1.753 m)    GEN- The patient is a well appearing obese male, alert and oriented x 3 today.   Head- normocephalic, atraumatic Eyes-  Sclera clear, conjunctiva pink Ears- hearing intact Oropharynx- clear Neck- supple  Lungs- Clear to ausculation bilaterally, normal work of breathing Heart- Regular rate and rhythm, no murmurs, rubs or gallops  GI- soft, NT, ND, + BS Extremities- no clubbing, cyanosis, or edema MS- no significant deformity or atrophy Skin- no rash or lesion Psych- euthymic mood, full affect Neuro- strength and sensation are intact  Wt Readings from Last 3 Encounters:  05/24/21 126.2 kg  05/15/21 129.3 kg  05/04/21 127 kg    EKG today demonstrates  SB Vent. rate 56 BPM PR interval 190 ms QRS duration 94 ms QT/QTcB 420/405 ms  Echo 01/03/21 demonstrated   1. Left ventricular ejection fraction, by estimation, is 60 to 65%. The  left ventricle has normal function.   2. Right ventricular systolic function is normal. The right ventricular  size is normal.   3. No left atrial/left atrial appendage thrombus was detected.   4. The mitral valve is normal in structure. No evidence of mitral valve  regurgitation.   5.  The aortic valve is normal in structure. Aortic valve regurgitation is  not visualized. No aortic stenosis is present.   6. Agitated saline contrast bubble study was negative, with no evidence  of any interatrial shunt.   Epic records are reviewed at length today  CHA2DS2-VASc Score = 6  The patient's score is based upon: CHF History: 0 HTN History: 1 Diabetes History: 1 Stroke History: 2 Vascular Disease History: 1 Age Score: 1 Gender Score: 0       ASSESSMENT AND PLAN: 1. Paroxysmal Atrial Fibrillation (ICD10:  I48.0) The patient's CHA2DS2-VASc score is 6, indicating a 9.7% annual risk of stroke.   General education about afib provided and questions answered. We also discussed his stroke risk and the risks and benefits of anticoagulation. Start Eliquis 5 mg BID. Would be able to hold this prior to knee replacement surgery if needed. Will reach out to Dr 03/03/21 regarding antiplatelet medication given his h/o PCI. Continue Lopressor 25 mg BID  2. Secondary Hypercoagulable State (ICD10:  D68.69) The patient is at significant risk for stroke/thromboembolism based upon his CHA2DS2-VASc Score of 6.  Start Apixaban (Eliquis).   3. Obesity Body mass index is 41.08 kg/m. Lifestyle modification was discussed at length including regular exercise and weight reduction.  4. Snoring/witnessed apnea The importance of adequate treatment of sleep apnea was discussed today in order to improve our ability to maintain sinus rhythm long term. F/u with neurology for possible sleep study.   5. CAD No anginal symptoms.  6. HTN Stable, no changes today.   Follow up in the AF clinic in 4-6 weeks.    Mathis Bud PA-C Afib Clinic [facility] [address] [place], [place] [phone] 05/24/2021 2:44 PM

## 2021-05-25 ENCOUNTER — Other Ambulatory Visit (HOSPITAL_COMMUNITY): Payer: Self-pay | Admitting: *Deleted

## 2021-05-25 MED ORDER — ASPIRIN 81 MG PO TBEC: 81.0000 mg | DELAYED_RELEASE_TABLET | Freq: Every day | ORAL | 3 refills | Status: AC

## 2021-06-01 ENCOUNTER — Ambulatory Visit: Payer: Managed Care, Other (non HMO) | Admitting: Neurology

## 2021-06-12 ENCOUNTER — Ambulatory Visit (INDEPENDENT_AMBULATORY_CARE_PROVIDER_SITE_OTHER): Payer: Managed Care, Other (non HMO)

## 2021-06-12 DIAGNOSIS — I5032 Chronic diastolic (congestive) heart failure: Secondary | ICD-10-CM | POA: Diagnosis not present

## 2021-06-13 LAB — CUP PACEART REMOTE DEVICE CHECK
Date Time Interrogation Session: 20230611211431
Implantable Pulse Generator Implant Date: 20230403

## 2021-06-15 ENCOUNTER — Telehealth: Payer: Self-pay | Admitting: Neurology

## 2021-06-15 NOTE — Telephone Encounter (Signed)
[employer] pending faxed notes to [facility]

## 2021-06-18 ENCOUNTER — Other Ambulatory Visit: Payer: Self-pay | Admitting: Cardiology

## 2021-06-19 NOTE — Telephone Encounter (Signed)
Checked the status it is still pending  

## 2021-06-20 ENCOUNTER — Other Ambulatory Visit: Payer: Self-pay

## 2021-06-20 MED ORDER — METOPROLOL TARTRATE 25 MG PO TABS: 25.0000 mg | ORAL_TABLET | Freq: Two times a day (BID) | ORAL | 3 refills | Status: DC

## 2021-06-20 NOTE — Telephone Encounter (Signed)
[employer]- Kirk Frazier auth: [id number] (exp. 06/15/21 to 09/17/21). Patient is scheduled at [facility] for 06/16/21 at 8 pm.

## 2021-06-20 NOTE — Progress Notes (Signed)
Metoprolol Tartrate 25 mg # 180 x 3 refills sent to [facility] [place] 

## 2021-06-26 ENCOUNTER — Ambulatory Visit (INDEPENDENT_AMBULATORY_CARE_PROVIDER_SITE_OTHER): Payer: Managed Care, Other (non HMO) | Admitting: Neurology

## 2021-06-26 DIAGNOSIS — G4730 Sleep apnea, unspecified: Secondary | ICD-10-CM

## 2021-06-26 DIAGNOSIS — I63411 Cerebral infarction due to embolism of right middle cerebral artery: Secondary | ICD-10-CM

## 2021-06-26 DIAGNOSIS — R0683 Snoring: Secondary | ICD-10-CM

## 2021-06-26 DIAGNOSIS — I5032 Chronic diastolic (congestive) heart failure: Secondary | ICD-10-CM

## 2021-06-26 DIAGNOSIS — H34231 Retinal artery branch occlusion, right eye: Secondary | ICD-10-CM

## 2021-06-26 DIAGNOSIS — G4737 Central sleep apnea in conditions classified elsewhere: Secondary | ICD-10-CM

## 2021-06-26 DIAGNOSIS — G4719 Other hypersomnia: Secondary | ICD-10-CM

## 2021-06-29 ENCOUNTER — Encounter (HOSPITAL_COMMUNITY): Payer: Self-pay | Admitting: Physician Assistant

## 2021-06-29 ENCOUNTER — Ambulatory Visit (HOSPITAL_COMMUNITY)
Admission: RE | Admit: 2021-06-29 | Discharge: 2021-06-29 | Disposition: A | Discharge status: Home / Self Care | Payer: Managed Care, Other (non HMO) | Source: Ambulatory Visit | Attending: Physician Assistant | Admitting: Physician Assistant

## 2021-06-29 VITALS — BP 134/82 | HR 66 | Ht 69.0 in | Wt 281.0 lb

## 2021-06-29 DIAGNOSIS — E785 Hyperlipidemia, unspecified: Secondary | ICD-10-CM | POA: Diagnosis not present

## 2021-06-29 DIAGNOSIS — Z8673 Personal history of transient ischemic attack (TIA), and cerebral infarction without residual deficits: Secondary | ICD-10-CM | POA: Insufficient documentation

## 2021-06-29 DIAGNOSIS — Z7984 Long term (current) use of oral hypoglycemic drugs: Secondary | ICD-10-CM | POA: Insufficient documentation

## 2021-06-29 DIAGNOSIS — Z7901 Long term (current) use of anticoagulants: Secondary | ICD-10-CM | POA: Insufficient documentation

## 2021-06-29 DIAGNOSIS — Z79899 Other long term (current) drug therapy: Secondary | ICD-10-CM | POA: Insufficient documentation

## 2021-06-29 DIAGNOSIS — I10 Essential (primary) hypertension: Secondary | ICD-10-CM | POA: Diagnosis not present

## 2021-06-29 DIAGNOSIS — I2510 Atherosclerotic heart disease of native coronary artery without angina pectoris: Secondary | ICD-10-CM | POA: Diagnosis not present

## 2021-06-29 DIAGNOSIS — I480 Paroxysmal atrial fibrillation: Secondary | ICD-10-CM | POA: Insufficient documentation

## 2021-06-29 DIAGNOSIS — D6869 Other thrombophilia: Secondary | ICD-10-CM | POA: Insufficient documentation

## 2021-06-29 DIAGNOSIS — E669 Obesity, unspecified: Secondary | ICD-10-CM | POA: Diagnosis not present

## 2021-06-29 DIAGNOSIS — E119 Type 2 diabetes mellitus without complications: Secondary | ICD-10-CM | POA: Diagnosis not present

## 2021-06-29 DIAGNOSIS — Z6841 Body Mass Index (BMI) 40.0 and over, adult: Secondary | ICD-10-CM | POA: Diagnosis not present

## 2021-06-29 LAB — CBC
HCT: 47.5 % (ref 39.0–52.0)
Hemoglobin: 16.1 g/dL (ref 13.0–17.0)
MCH: 31.1 pg (ref 26.0–34.0)
MCHC: 33.9 g/dL (ref 30.0–36.0)
MCV: 91.7 fL (ref 80.0–100.0)
Platelets: 212 10*3/uL (ref 150–400)
RBC: 5.18 MIL/uL (ref 4.22–5.81)
RDW: 13.8 % (ref 11.5–15.5)
WBC: 6.1 10*3/uL (ref 4.0–10.5)
nRBC: 0 % (ref 0.0–0.2)

## 2021-06-29 LAB — BASIC METABOLIC PANEL
Anion gap: 12 (ref 5–15)
BUN: 21 mg/dL (ref 8–23)
CO2: 21 mmol/L — ABNORMAL LOW (ref 22–32)
Calcium: 9.2 mg/dL (ref 8.9–10.3)
Chloride: 107 mmol/L (ref 98–111)
Creatinine, Ser: 1.17 mg/dL (ref 0.61–1.24)
GFR, Estimated: >60 mL/min (ref >60)
Glucose, Bld: 243 mg/dL — ABNORMAL HIGH (ref 70–99)
Potassium: 4.4 mmol/L (ref 3.5–5.1)
Sodium: 140 mmol/L (ref 135–145)

## 2021-06-29 NOTE — Progress Notes (Signed)
Primary Care Physician: Olive Bass, MD Primary Cardiologist: Dr Dulce Sellar Primary Electrophysiologist: Dr Elberta Fortis Referring Physician: Dr Kathrine Haddock is a 66 y.o. male with a history of CAD, HTN, HLD, DM, CVA, atrial fibrillation who presents for follow up in the [facility]. He was seen at [facility] 01/23/2021 with recurrent stroke symptoms. He wore a 30-day monitor that showed no evidence of atrial fibrillation. He was seen by Dr Elberta Fortis on 04/03/21 and an ILR was placed for long term monitoring. The device clinic received an alert on 05/22/21 for an afib episode lasting 3 hours and 20 minutes. Patient has a CHADS2VASC score of 6. He was asymptomatic during the episode. He denies alcohol use but does snore. He has an appointment with neurology to discuss sleep study.   On follow up today, patient reports that he has done well since his last visit. ILR shows no interim episodes of afib. He denies any bleeding issues since starting Eliquis.   Today, he denies symptoms of palpitations, chest pain, shortness of breath, orthopnea, PND, lower extremity edema, dizziness, presyncope, syncope, bleeding, or neurologic sequela. The patient is tolerating medications without difficulties and is otherwise without complaint today.    Atrial Fibrillation Risk Factors:  he does have symptoms or diagnosis of sleep apnea. he is scheduled to discuss sleep study with neurology.  he does not have a history of rheumatic fever. he does not have a history of alcohol use. The patient does have a history of early familial atrial fibrillation or other arrhythmias. Father had afib.  he has a BMI of Body mass index is 41.5 kg/m.Marland Kitchen Filed Weights   06/29/21 1413  Weight: 127.5 kg     Family History  Problem Relation Age of Onset   Hypertension Mother    Heart failure Mother    Cancer Father    Heart failure Father    Diabetes Father    Thyroid disease  Sister    Heart attack Brother    Diabetes Brother      Atrial Fibrillation Management history:  Previous antiarrhythmic drugs: none Previous cardioversions: none Previous ablations: none CHADS2VASC score: 6 Anticoagulation history: Eliquis   Past Medical History:  Diagnosis Date   Benign hypertension 02/10/2015   CAD in native artery 07/07/2014   Overview:  PCI and DES to LCFx 01/08/14 for Troponin  Normal unstable angina   Chronic diastolic heart failure (HCC) 07/07/2014   Coronary artery disease 02/10/2015   Overview:  Managed CARDS 2016: CIRC 95% PCI Resolute stent LAD 40% EF 65%   Essential hypertension 07/07/2014   Gout 02/10/2015   Overview:  2017: sUA=9.6   Hemoglobinemia due to blood transfusion 05/31/2016   Overview:  1982   Hyperlipidemia 07/07/2014   Nephrolithiasis 02/10/2015   Post-traumatic osteoarthritis of both knees 03/22/2016   Rectal bleeding 02/28/2015   Rib pain on right side 05/31/2016   Overview:  2014: injury   Screening for colon cancer 05/30/2016   Screening for prostate cancer 05/30/2016   Stroke (HCC) 01/23/2021   Type 2 diabetes mellitus without complication, without long-term current use of insulin (HCC) 02/10/2015   Overview:  2016: 6.5 2017: 412 random with A1C 7.7, started MTF   Wellness examination 05/30/2016   Past Surgical History:  Procedure Laterality Date   BUBBLE STUDY  01/03/2021   Procedure: BUBBLE STUDY;  Surgeon: [name], MD;  Location: [facility] ENDOSCOPY;  Service: Cardiovascular;;   CARPAL TUNNEL RELEASE  CORONARY ANGIOPLASTY WITH STENT PLACEMENT     KNEE SURGERY     TEE WITHOUT CARDIOVERSION N/A 01/03/2021   Procedure: TRANSESOPHAGEAL ECHOCARDIOGRAM (TEE);  Surgeon: Elease Hashimoto Deloris Ping, MD;  Location: [facility] ENDOSCOPY;  Service: Cardiovascular;  Laterality: N/A;   TRIGGER FINGER RELEASE      Current Outpatient Medications  Medication Sig Dispense Refill   allopurinol (ZYLOPRIM) 300 MG tablet Take 300 mg by mouth daily.      apixaban (ELIQUIS) 5 MG TABS tablet Take 1 tablet (5 mg total) by mouth 2 (two) times daily. 60 tablet 3   aspirin EC 81 MG tablet Take 1 tablet (81 mg total) by mouth daily. Swallow whole. 90 tablet 3   atorvastatin (LIPITOR) 80 MG tablet Take 1 tablet (80 mg total) by mouth daily. 90 tablet 2   Colchicine 0.6 MG CAPS Take 0.6 mg by mouth daily as needed for other (Gout).     ezetimibe (ZETIA) 10 MG tablet Take 10 mg by mouth daily.     FARXIGA 10 MG TABS tablet Take 10 mg by mouth every morning.     fluorouracil (EFUDEX) 5 % cream Apply 1 application topically daily as needed (Skin rash).     Ivermectin 1 % CREA Apply 1 application topically daily as needed (Skin cancers).     metFORMIN (GLUCOPHAGE-XR) 500 MG 24 hr tablet Take 1,000 mg by mouth 2 (two) times daily.      metoprolol tartrate (LOPRESSOR) 25 MG tablet Take 1 tablet (25 mg total) by mouth 2 (two) times daily. 180 tablet 3   nitroGLYCERIN (NITROSTAT) 0.4 MG SL tablet PLACE 1 TABLET (0.4 MG TOTAL) UNDER THE TONGUE EVERY FIVE (5) MINUTES AS NEEDED FOR CHEST PAIN. 30 tablet 3   potassium chloride (KLOR-CON) 10 MEQ tablet TAKE 1 TABLET BY MOUTH EVERY DAY 90 tablet 2   RYBELSUS 7 MG TABS Take 0.5 tablets by mouth daily.     tadalafil (CIALIS) 10 MG tablet Take 10 mg by mouth daily as needed for erectile dysfunction.     tamsulosin (FLOMAX) 0.4 MG CAPS capsule Take 0.4 mg by mouth 2 (two) times daily.     terbinafine (LAMISIL) 250 MG tablet Take 250 mg by mouth daily.     torsemide (DEMADEX) 20 MG tablet TAKE 1 TABLET (20 MG TOTAL) BY MOUTH 2 (TWO) TIMES DAILY. INSUR WILL PAY ON 02/21/19 180 tablet 3   valACYclovir (VALTREX) 500 MG tablet Take 500 mg by mouth See admin instructions. Take 4 tables by mouth ever 12 hours for 1 day     No current facility-administered medications for this encounter.    Allergies  Allergen Reactions   Morphine Nausea And Vomiting   Pseudoephedrine Hcl Rash    Social History   Socioeconomic History    Marital status: Married    Spouse name: Not on file   Number of children: Not on file   Years of education: Not on file   Highest education level: Associate degree: academic program  Occupational History   Not on file  Tobacco Use   Smoking status: Former    Passive exposure: Past   Smokeless tobacco: Never   Tobacco comments:    Former smoker 05/24/21  Vaping Use   Vaping Use: Never used  Substance and Sexual Activity   Alcohol use: No   Drug use: No   Sexual activity: Not on file  Other Topics Concern   Not on file  Social History Narrative   Right handed  Caffeine- 2 cups of coffee per day    Live at home with wife    Social Determinants of Health   Financial Resource Strain: Not on file  Food Insecurity: Not on file  Transportation Needs: Not on file  Physical Activity: Not on file  Stress: Not on file  Social Connections: Not on file  Intimate Partner Violence: Not on file     ROS- All systems are reviewed and negative except as per the HPI above.  Physical Exam: Vitals:   06/29/21 1413  BP: 134/82  Pulse: 66  Weight: 127.5 kg  Height: 5\' 9"  (1.753 m)     GEN- The patient is a well appearing obese male, alert and oriented x 3 today.   HEENT-head normocephalic, atraumatic, sclera clear, conjunctiva pink, hearing intact, trachea midline. Lungs- Clear to ausculation bilaterally, normal work of breathing Heart- Regular rate and rhythm, no murmurs, rubs or gallops  GI- soft, NT, ND, + BS Extremities- no clubbing, cyanosis, or edema MS- no significant deformity or atrophy Skin- no rash or lesion Psych- euthymic mood, full affect Neuro- strength and sensation are intact   Wt Readings from Last 3 Encounters:  06/29/21 127.5 kg  05/24/21 126.2 kg  05/15/21 129.3 kg    EKG today demonstrates  SR Vent. rate 66 BPM PR interval 206 ms QRS duration 96 ms QT/QTcB 418/438 ms  Echo 01/03/21 demonstrated   1. Left ventricular ejection fraction, by  estimation, is 60 to 65%. The  left ventricle has normal function.   2. Right ventricular systolic function is normal. The right ventricular  size is normal.   3. No left atrial/left atrial appendage thrombus was detected.   4. The mitral valve is normal in structure. No evidence of mitral valve regurgitation.   5. The aortic valve is normal in structure. Aortic valve regurgitation is not visualized. No aortic stenosis is present.   6. Agitated saline contrast bubble study was negative, with no evidence of any interatrial shunt.   Epic records are reviewed at length today  CHA2DS2-VASc Score = 6  The patient's score is based upon: CHF History: 0 HTN History: 1 Diabetes History: 1 Stroke History: 2 Vascular Disease History: 1 Age Score: 1 Gender Score: 0       ASSESSMENT AND PLAN: 1. Paroxysmal Atrial Fibrillation (ICD10:  I48.0) The patient's CHA2DS2-VASc score is 6, indicating a 9.7% annual risk of stroke.   ILR shows no interim episodes of afib. Continue Eliquis 5 mg BID Check bmet/cbc today. Continue Lopressor 25 mg BID  2. Secondary Hypercoagulable State (ICD10:  D68.69) The patient is at significant risk for stroke/thromboembolism based upon his CHA2DS2-VASc Score of 6.  Continue Apixaban (Eliquis).   3. Obesity Body mass index is 41.5 kg/m. Lifestyle modification was discussed and encouraged including regular physical activity and weight reduction.  4. CAD No anginal symptoms.  5. HTN Stable, no changes today.   Follow up in the AF clinic in 6 months.    03/03/21 PA-C Afib Clinic [facility] [address] [place], [place] [phone] 06/29/2021 4:25 PM

## 2021-06-29 NOTE — Progress Notes (Signed)
Carelink Summary Report / Loop Recorder 

## 2021-06-30 NOTE — Procedures (Signed)
PATIENT'S NAME:  Kirk, Frazier DOB:      [birth year]      MR#:    [id number]     DATE OF RECORDING: 06/26/2021 C. Lucretia Kern M.D.:  Delia Heady, MD Study Performed:   Baseline Polysomnogram HISTORY:  Kirk Frazier is a 66 y.o. Caucasian male patient of Dr. 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 CHF, Left hemiparesis, hemianopsia, DM2, HTN, Gout, CAD, loss of muscle mass. Sleep history: "I usually sleep through the night". Wife witnessed snoring, Sleep walking in his youth. Dreams are reportedly frequent/vivid.  The patient endorsed the Epworth Sleepiness Scale at 16/24 points.  FSS at 53/63 points The patient's weight 280 pounds with a height of 69 (inches), resulting in a BMI of 41.5 kg/m2. The patient's neck circumference measured 19.3 inches.  CURRENT MEDICATIONS: Zyloprim, Lipitor, Plavix, Colchicine, Farxiga, Zetia, Efudex, Ivermectin, Glucophage-XR, Lopressor, Nitrostat, Klor-Con, Rybelsus, Cialis, Flomax, Lamisil, Demadex, Valtrex  PROCEDURE:  This is a multichannel digital polysomnogram utilizing the Somnostar 11.2 system.  Electrodes and sensors were applied and monitored per AASM Specifications.   EEG, EOG, Chin and Limb EMG, were sampled at 200 Hz.  ECG, Snore and Nasal Pressure, Thermal Airflow, Respiratory Effort, CPAP Flow and Pressure, Oximetry was sampled at 50 Hz. Digital video and audio were recorded.      BASELINE STUDY: Lights Out was at 20:39 and Lights On at 05:13.  Total recording time (TRT) was 514.5 minutes, with a total sleep time (TST) of 462 minutes.   The patient's sleep latency was 18 minutes.  REM latency was 116 minutes.  The sleep efficiency was 89.8 %.     SLEEP ARCHITECTURE: WASO (Wake after sleep onset) was 34.5 minutes.   There were 19.5 minutes in Stage N1, 267.5 minutes Stage N2, 91 minutes Stage N3 and 84 minutes in Stage REM.  The  percentage of Stage N1 was 4.2%, Stage N2 was 57.9%, Stage N3 was 19.7% and Stage R (REM sleep) was 18.2%.   RESPIRATORY ANALYSIS:  There were a total of 156 respiratory events:   1 obstructive apnea, 60 central apneas and 2 mixed apneas with a total of 63 apneas and an apnea index (AI) of 8.2 /hour. There were 93 hypopneas with a hypopnea index of 12.1 /hour.  The patient also displayed cyclic breathing (epoch 672 -715) and hypoxia.   The total APNEA/HYPOPNEA INDEX (AHI) was 20.3/hour.    43 events occurred in REM sleep and 164 events in NREM.  The REM AHI was  30.7 /hour, versus a non-REM AHI of 17.9/h.  The patient spent 192.5 minutes of total sleep time in the supine position and 270 minutes in non-supine.  The supine AHI was 14.6/h versus a non-supine AHI of 24.3/h.  OXYGEN SATURATION & C02:  The Wake baseline 02 saturation was 94%, with the lowest being 81%. Time spent below 89% saturation equaled 45 minutes.   PERIODIC LIMB MOVEMENTS:   The patient had a total of 244 Periodic Limb Movements.  The Periodic Limb Movement (PLM) Arousal index was 1.2/hour. The arousals were noted as: 32 were spontaneous, 9 were associated with PLMs, 12 were associated with respiratory events. Audio and video analysis did not show any abnormal or unusual movements, behaviors, phonations or vocalizations.    The patient took 2 bathroom breaks. Snoring was noted for most of the night. EKG was bradycardic, intermittently , but regular HR.  IMPRESSION: There was only 1 obstructive apnea, but 60 central apneas and 2 mixed apneas with 93 hypopneas. Oxygen nadir at 81%. Time spent below 89% saturation equaled 45 minutes. Fragmented sleep.    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 And more obstructive apnea/ hypopnea with severe hypoxia in REM sleep, see attached screen shots. Hypoventilation pattern dominated NREM sleep PLMs were frequently  seen, some followed apnea, some intruded into REM sleep.    RECOMMENDATIONS:  Advise full night, attended, PAP titration study to optimize therapy. This patient is at high risk of worsening central apnea under CPAP, please advance to BiPAP and ST, ASV as indicated.  Please add 02 if desaturation remains present once AHI is reduced to 10 or less. Desensitization mask fit upon arrival, early arrival to be scheduled.    I certify that I have reviewed the entire raw data recording prior to the issuance of this report in accordance with the Standards of Accreditation of the American Academy of Sleep Medicine (AASM)   Melvyn Novas, MD Medical Director, [facility], American Board of Neurology and Sleep Medicine (Neurology and Sleep Medicine)

## 2021-06-30 NOTE — Addendum Note (Signed)
Addended by: Melvyn Novas on: 06/30/2021 12:37 PM   Modules accepted: Orders

## 2021-07-03 ENCOUNTER — Telehealth: Payer: Self-pay | Admitting: *Deleted

## 2021-07-03 NOTE — Telephone Encounter (Signed)
LVM for pt to call about results. °

## 2021-07-03 NOTE — Telephone Encounter (Signed)
-----   Message from Melvyn Novas, MD sent at 06/30/2021 12:37 PM EDT ----- IMPRESSION: There was only 1 obstructive apnea, but 60 central apneas and 2 mixed apneas with 93 hypopneas. Oxygen nadir at 81%. Time spent below 89% saturation equaled 45 minutes. Fragmented sleep.    1. 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 2. And more obstructive apnea/ hypopnea with severe hypoxia in REM sleep., see attached screen shots. 3. Hypoventilation pattern dominated NREM sleep 4. PLMs were frequently seen, some followed apnea, some intruded into REM sleep.    RECOMMENDATIONS:  1. Advise full night, attended, PAP titration study to optimize therapy. This patient is at high risk of worsening central apnea under CPAP, please advance to BiPAP and ST, ASV as indicated.  2. Please add 02 if desaturation remains present once AHI is reduced to 10 or less. 3. Desensitization mask fit upon arrival, early arrival to be scheduled.   Please call patient with result

## 2021-07-05 ENCOUNTER — Encounter: Payer: Self-pay | Admitting: Neurology

## 2021-07-10 ENCOUNTER — Telehealth: Payer: Self-pay | Admitting: Neurology

## 2021-07-10 NOTE — Telephone Encounter (Signed)
[employer] pending faxed notes  

## 2021-07-11 NOTE — Telephone Encounter (Signed)
CPAP Titration- [employer] auth: [id number](exp. 07/10/21 to 10/08/21). Patient is scheduled at [facility] for 07/27/21 at 8 pm. I mailed packet out as well.

## 2021-07-17 ENCOUNTER — Ambulatory Visit (INDEPENDENT_AMBULATORY_CARE_PROVIDER_SITE_OTHER): Payer: Managed Care, Other (non HMO)

## 2021-07-17 DIAGNOSIS — I639 Cerebral infarction, unspecified: Secondary | ICD-10-CM | POA: Diagnosis not present

## 2021-07-17 LAB — CUP PACEART REMOTE DEVICE CHECK
Date Time Interrogation Session: 20230714211525
Implantable Pulse Generator Implant Date: 20230403

## 2021-07-27 ENCOUNTER — Ambulatory Visit (INDEPENDENT_AMBULATORY_CARE_PROVIDER_SITE_OTHER): Payer: Managed Care, Other (non HMO) | Admitting: Neurology

## 2021-07-27 DIAGNOSIS — I5032 Chronic diastolic (congestive) heart failure: Secondary | ICD-10-CM

## 2021-07-27 DIAGNOSIS — G4730 Sleep apnea, unspecified: Secondary | ICD-10-CM | POA: Diagnosis not present

## 2021-07-27 DIAGNOSIS — G4719 Other hypersomnia: Secondary | ICD-10-CM

## 2021-07-27 DIAGNOSIS — G4737 Central sleep apnea in conditions classified elsewhere: Secondary | ICD-10-CM

## 2021-07-27 DIAGNOSIS — I63411 Cerebral infarction due to embolism of right middle cerebral artery: Secondary | ICD-10-CM

## 2021-07-27 DIAGNOSIS — E66813 Obesity, class 3: Secondary | ICD-10-CM

## 2021-07-27 DIAGNOSIS — R0683 Snoring: Secondary | ICD-10-CM | POA: Diagnosis not present

## 2021-07-30 DIAGNOSIS — G4737 Central sleep apnea in conditions classified elsewhere: Secondary | ICD-10-CM

## 2021-07-30 HISTORY — DX: Central sleep apnea in conditions classified elsewhere: G47.37

## 2021-07-30 NOTE — Procedures (Signed)
PATIENT'S NAME:  Kirk Frazier, Steele DOB:      03-29-1955      MR#:    [id number]     DATE OF RECORDING: 07/27/2021 REFERRING M.D.:  Delia Heady, MD Study Performed:   CPAP  Titration HISTORY:   Kirk Frazier is a 66 y.o. Caucasian male patient of Dr. 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 CHF, atrial fibrillation was questioned.  Left hemiparesis, hemianopsia, DM2, HTN, Gout, CAD, loss of muscle mass.  Sleep history: "I usually sleep through the night". Dreams are reportedly frequent/vivid.  The patient endorsed the Epworth Sleepiness Scale at 16/24 points.  FSS at 53/63 points The patient's weight 280 pounds with a height of 69 (inches), resulting in a BMI of 41.5 kg/m2. The patient's neck circumference measured 19.3 inches. The patient returned for a PAP titration after his Diagnostic polysomnogram, performed on 06/26/2021, revealed: There was only 1 obstructive apnea, but 60 central apneas and 2 mixed apneas with 93 hypopneas. Oxygen nadir at 81%. Time spent below 89% saturation equaled 45 minutes. Fragmented sleep.   Complex apnea/ Central Sleep Apnea with cyclic breathing in NREM sleep, see attached screen shots. And more obstructive apnea/ hypopnea with severe hypoxia in REM sleep, see attached screen shots. Hypoventilation pattern dominated NREM sleep PLMs were frequently seen, some followed apnea, some intruded into REM sleep.  CURRENT MEDICATIONS: Zyloprim, Lipitor, Plavix, Colchicine, Farxiga, Zetia, Efudex, Ivermectin cream, Glucophage-XR, Lopressor, Nitrostat, Klor-Con, Rybelsus, Cialis, Flomax, Lamisil, Demadex, Valtrex   PROCEDURE:  This is a multichannel digital polysomnogram utilizing the SomnoStar 11.2 system.  Electrodes and sensors were applied and monitored per AASM Specifications.   EEG, EOG, Chin and Limb EMG, were sampled at 200 Hz.  ECG, Snore and Nasal  Pressure, Thermal Airflow, Respiratory Effort, CPAP Flow and Pressure, Oximetry was sampled at 50 Hz. Digital video and audio were recorded.      PAP was initiated with FFM (Vitera, small size) at 5 cmH20 with heated humidity per AASM standards and pressure was advanced to 11/cmH20 because of hypopneas, apneas and desaturations.  At a final explored PAP pressure of 11 cmH20 a reduction of the AHI to 0/h was reached with improvement of sleep apnea - but the patient did enter REM sleep while under this pressure and did briefly sleep in supine. The total sleep time was 23.5 minutes.  Lights Out was at 20:43 and Lights On at 05:22. Total recording time (TRT) was 519 minutes, with a total sleep time (TST) of 381.5 minutes. The patient's sleep latency was 54.5 minutes. REM latency was 83.5 minutes. Sleep efficiency was 73.5 %.    SLEEP ARCHITECTURE: WASO (Wake after sleep onset) was 82 minutes.  There were 14.5 minutes in Stage N1, 214 minutes Stage N2, 51.5 minutes Stage N3 and 101.5 minutes in Stage REM.  The percentage of Stage N1 was 3.8%, Stage N2 was 56.1%, Stage N3 was 13.5% and Stage R (REM sleep) was 26.6%.   RESPIRATORY ANALYSIS:  There was a total of 24 respiratory events: 1 obstructive apneas, 10 central apneas and 3 mixed apneas with a total of 14 apneas and an apnea index (AHI) of 2.2 /hour. There were 10 hypopneas with a hypopnea index of 1.6/hour   The total APNEA/HYPOPNEA INDEX  (AHI) was 3.8 /hour /  1 events occurred in REM sleep and 23 events in NREM. The REM AHI was 0.6 /hour versus  a non-REM AHI of 4.9 /hour.   The patient spent 167.5 minutes of total sleep time in the supine position and 214 minutes in non-supine. The supine AHI was 6.8/h, versus a non-supine AHI of 1.4.  OXYGEN SATURATION & C02:  The baseline 02 saturation was 94%, with the lowest being 88%. Time spent below 89% saturation equaled 0.5 minutes. The arousals were noted as: 18 were spontaneous, 0 were associated with  PLMs, 0 were associated with respiratory events. The patient had a total of 0 Periodic Limb Movements.  Audio and video analysis did not show any abnormal or unusual movements, behaviors, phonations or vocalizations.   The patient took bathroom breaks. EKG was in keeping with normal sinus rhythm (!).  DIAGNOSIS Complex Sleep Apnea and hypoxia improved under CPAP titration- the patient tolerated several  pressure settings, did initially well under 7 and 9 cm water, but during REM sleep an increase in pressure to 11 cm water was necessary. Sleep Related Hypoxemia was no longer present.  PLANS/RECOMMENDATIONS: The patient was fitted with a Small Vitera full face mask. He is to be furbished with an autotitration CPAP device by [employer] , for a  pressure setting from 7-15 cm water, 1 cm EPR, heated humidification and mask as named.  DISCUSSION: A follow up appointment will be scheduled in the Sleep Clinic at [facility].   Please call [phone] with any questions.     Cc Dr Pearlean Brownie, MD  I certify that I have reviewed the entire raw data recording prior to the issuance of this report in accordance with the Standards of Accreditation of the American Academy of Sleep Medicine (AASM)  Melvyn Novas, M.D. Medical Director, [facility] [employer], [employer] of Neurology and Sleep Medicine ( Neurology and Sleep Medicine)

## 2021-07-30 NOTE — Addendum Note (Signed)
Addended by: Melvyn Novas on: 07/30/2021 05:18 PM   Modules accepted: Orders

## 2021-07-31 ENCOUNTER — Telehealth: Payer: Self-pay | Admitting: Neurology

## 2021-07-31 NOTE — Telephone Encounter (Signed)
I called pt. I advised pt that Dr. Vickey Huger reviewed their sleep study results and found that pt was best treated at a CPAP pressure of 11 cm. Dr. Vickey Huger recommends that pt starts auto CPAP. I reviewed PAP compliance expectations with the pt. Pt is agreeable to starting a CPAP. I advised pt that an order will be sent to a DME, [facility], and [facility] will call the pt within about one week after they file with the pt's insurance. [facility] will show the pt how to use the machine, fit for masks, and troubleshoot the CPAP if needed. A follow up appt was made for insurance purposes with Ihor Austin on 10/09/2021 at 8:15 am. Pt verbalized understanding to arrive 15 minutes early and bring their CPAP. A letter with all of this information in it will be mailed to the pt as a reminder. I verified with the pt that the address we have on file is correct. Pt verbalized understanding of results. Pt had no questions at this time but was encouraged to call back if questions arise. I have sent the order to [facility] and have received confirmation that they have received the order.

## 2021-07-31 NOTE — Telephone Encounter (Signed)
-----   Message from Melvyn Novas, MD sent at 07/30/2021  5:18 PM EDT ----- DIAGNOSIS 1. Complex Sleep Apnea and hypoxia improved under CPAP titration- the patient tolerated several  pressure settings, did initially well under 7 and 9 cm water, but during REM sleep an increase in pressure to 11 cm water was necessary. Sleep Related Hypoxemia was no longer present.  PLANS/RECOMMENDATIONS: The patient was fitted with a Small Vitera full face mask. He is to be furbished with an autotitration CPAP device by [employer] , for a  pressure setting from 7-15 cm water, 1 cm EPR, heated humidification and mask as named.  DISCUSSION: A follow up appointment will be scheduled in the Sleep Clinic at [facility].   Please call [phone] with any questions.     Cc Dr Pearlean Brownie, MD

## 2021-08-17 LAB — CUP PACEART REMOTE DEVICE CHECK
Date Time Interrogation Session: 20230816211422
Implantable Pulse Generator Implant Date: 20230403

## 2021-08-17 NOTE — Progress Notes (Signed)
Carelink Summary Report / Loop Recorder 

## 2021-08-21 ENCOUNTER — Ambulatory Visit (INDEPENDENT_AMBULATORY_CARE_PROVIDER_SITE_OTHER): Payer: Managed Care, Other (non HMO)

## 2021-08-21 DIAGNOSIS — I639 Cerebral infarction, unspecified: Secondary | ICD-10-CM | POA: Diagnosis not present

## 2021-09-07 ENCOUNTER — Ambulatory Visit: Payer: Managed Care, Other (non HMO) | Admitting: Neurology

## 2021-09-17 NOTE — Progress Notes (Signed)
Carelink Summary Report / Loop Recorder 

## 2021-09-22 ENCOUNTER — Other Ambulatory Visit (HOSPITAL_COMMUNITY): Payer: Self-pay | Admitting: Physician Assistant

## 2021-09-25 ENCOUNTER — Ambulatory Visit (INDEPENDENT_AMBULATORY_CARE_PROVIDER_SITE_OTHER): Payer: Managed Care, Other (non HMO)

## 2021-09-25 DIAGNOSIS — I639 Cerebral infarction, unspecified: Secondary | ICD-10-CM | POA: Diagnosis not present

## 2021-09-26 DIAGNOSIS — Z0271 Encounter for disability determination: Secondary | ICD-10-CM

## 2021-09-26 LAB — CUP PACEART REMOTE DEVICE CHECK
Date Time Interrogation Session: 20230918211656
Implantable Pulse Generator Implant Date: 20230403

## 2021-10-05 ENCOUNTER — Other Ambulatory Visit: Payer: Self-pay | Admitting: Cardiology

## 2021-10-05 NOTE — Telephone Encounter (Signed)
Refill to pharmacy 

## 2021-10-05 NOTE — Progress Notes (Addendum)
[facility] [address] [place]. [place] [address] [phone]       OFFICE FOLLOW UP VISIT NOTE  [name] Date of Birth:  09-11-1955 Medical Record Number:  [id number]   Primary neurologist: Dr. Leonie Man Sleep neurologist: Dr. Brett Fairy  Reason for visit: Stroke and CPAP follow-up    Chief Complaint  Patient presents with   Follow-up    RM 3 with Helene Kelp Pt is well and stable, no stroke or cpap concerns        HPI:  Update 10/09/2021 JM: Patient returns for stroke and initial CPAP follow-up visit accompanied by his wife.  Previously seen for stroke follow-up 7 months ago with Dr. Leonie Man.  He has been stable from stroke standpoint without new stroke/TIA symptoms.  Loop recorder placed back in April and showed evidence of A-fib in May and has since been started on Eliquis 5 mg twice daily in addition to aspirin, tolerating well.  Closely followed by A-fib clinic.  Also remains on Zetia and atorvastatin.  Blood pressure well controlled.  Routinely follows with PCP. He does mention occasional posterior headaches which seem to improve after eating.   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  Review of compliance report as below showing excellent usage although residual AHI 7.7. Is sleeping better, no longer snores. A little more energy during the day. Does note leaks when laying on side, can wake him up in the middle of the night, will readjust mask with resolution of leaks.   Epworth Sleepiness Scale 14/24 (prior to CPAP 16/24) Fatigue severity scale 23/63 (prior to CPAP 53/63)          History provided for reference purposes only Update 03/09/2021 Dr. Leonie Man: Patient is seen for follow-up after last visit 2 months ago.  He was seen in the emergency room on 01/24/2021 with sudden onset of transient facial numbness.  At the time he arrived to the ER numbness was resolving.  NIH stroke  scale was 0.  CT head was unremarkable and MRI scan of the brain showed recurrent small acute embolic infarcts in the right posterior frontal lobe and frontoparietal junction in the MCA territory.  Patient was restarted on dual antiplatelet therapy aspirin and Plavix and was discharged home as there was no need to be admitted for any further work-up and patient had no therapy needs.  Patient states is done well since discharge he has had no other recurrent episodes of numbness or strokes.  He underwent 30-day outpatient cardiac monitor from 01/19/2021 to 02/17/2021 which showed no evidence of atrial fibrillation or significant arrhythmias.  He underwent a TEE on 01/03/2021 which was negative for any cardiac source for embolism, clot or PFO.  Patient remains on aspirin and Plavix and tolerating well with minor bruising but no bleeding.  He has an upcoming appointment with Dr. Curt Bears but is not sure if it is for loop recorder or discussion of results of 30-day heart monitor.  He has no other complaints today.  He is filed for long-term disability.  Continues to walk with a cane but this is mostly because he favors his right leg which has right knee replacement done.   Initial visit 01/12/2021 Dr. Leonie Man: 66 year old Caucasian male seen today for initial office consultation visit for stroke.  History is obtained from the patient, review of electronic medical records and I personally reviewed pertinent available imaging films in PACS.  He has past medical  history of hypertension, diabetes, hyperlipidemia, coronary artery disease, congestive heart failure, history of colon and prostate cancer.  Patient states states he developed sudden onset of right eye vision loss in the upper nasal quadrant along with left upper extremity numbness on 12/11/2020.  He presented to [facility] where he was seen by telemetry neurologist and after careful discussion of risk benefits of tPA he chose not to take it and got hospitalized.   MRI scan showed small tiny right frontal cortical acute as well as subacute infarcts.  His left-sided numbness resolved fairly quickly but right eye upper field partial visual defect has persisted.  2D echo showed normal ejection fraction without cardiac source of embolism.  Study was slightly suboptimal due to poor acoustic windows.  CTA of the head and neck revealed no significant large vessel stenosis or occlusion.  LDL cholesterol is 53.6 mg percent and hemoglobin A1c was elevated at 8.2.  Patient was discharged on aspirin and Plavix which she was given previously taking which he is tolerating well without bleeding or bruising.  He has had no recurrent stroke or TIA symptoms though his partial right eye peripheral vision loss persists.  He denies any prior history of known strokes or TIAs.  He is currently wearing a 30-day heart monitor.  He has upcoming appointment to see Dr. Curt Bears electrophysiologist for presumably loop recorder.  He denies known prior history of atrial fibrillation, palpitations, syncope or near syncopal events.     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 05/31/2016   Overview:  1982   Hyperlipidemia 07/07/2014   Nephrolithiasis 02/10/2015   Post-traumatic osteoarthritis of both knees 03/22/2016   Rectal bleeding 02/28/2015   Rib pain on right side 05/31/2016   Overview:  2014: injury   Screening for colon cancer 05/30/2016    Screening for prostate cancer 05/30/2016   Stroke ([place]) 01/23/2021   Type 2 diabetes mellitus without complication, without long-term current use of insulin ([place]) 02/10/2015   Overview:  2016: 6.5 2017: 412 random with A1C 7.7, started MTF   Wellness examination 05/30/2016    Social History:  Social History   Socioeconomic History   Marital status: Married    Spouse name: Not on file   Number of children: Not on file   Years of education: Not on file   Highest education level: Associate degree: academic program  Occupational History   Not on file  Tobacco Use   Smoking status: Former    Passive exposure: Past   Smokeless tobacco: Never   Tobacco comments:    Former smoker 05/24/21  Vaping Use   Vaping Use: Never used  Substance and Sexual Activity   Alcohol use: No   Drug use: No   Sexual activity: Not on file  Other Topics Concern   Not on file  Social History Narrative   Right handed    Caffeine- 2 cups of  coffee per day    Live at home with wife    Social Determinants of Health   Financial Resource Strain: Not on file  Food Insecurity: Not on file  Transportation Needs: Not on file  Physical Activity: Not on file  Stress: Not on file  Social Connections: Not on file  Intimate Partner Violence: Not on file    Medications:   Current Outpatient Medications on File Prior to Visit  Medication Sig Dispense Refill   allopurinol (ZYLOPRIM) 300 MG tablet Take 300 mg by mouth daily.     aspirin EC 81 MG tablet Take 1 tablet (81 mg total) by mouth daily. Swallow whole. 90 tablet 3   atorvastatin (LIPITOR) 80 MG tablet Take 1 tablet (80 mg total) by mouth daily. 90 tablet 2   Colchicine 0.6 MG CAPS Take 0.6 mg by mouth daily as needed for other (Gout).     ELIQUIS 5 MG TABS tablet TAKE 1 TABLET BY MOUTH TWICE A DAY 60 tablet 11   ezetimibe (ZETIA) 10 MG tablet Take 10 mg by mouth daily.     FARXIGA 10 MG TABS tablet Take 10 mg by mouth every morning.     fluorouracil  (EFUDEX) 5 % cream Apply 1 application topically daily as needed (Skin rash).     Ivermectin 1 % CREA Apply 1 application topically daily as needed (Skin cancers).     metFORMIN (GLUCOPHAGE-XR) 500 MG 24 hr tablet Take 1,000 mg by mouth 2 (two) times daily.      metoprolol tartrate (LOPRESSOR) 25 MG tablet Take 1 tablet (25 mg total) by mouth 2 (two) times daily. 180 tablet 3   nitroGLYCERIN (NITROSTAT) 0.4 MG SL tablet PLACE 1 TABLET UNDER THE TONGUE EVERY FIVE MINUTES AS NEEDED FOR CHEST PAIN. 25 tablet 3   potassium chloride (KLOR-CON) 10 MEQ tablet TAKE 1 TABLET BY MOUTH EVERY DAY 90 tablet 2   RYBELSUS 7 MG TABS Take 0.5 tablets by mouth daily.     tadalafil (CIALIS) 10 MG tablet Take 10 mg by mouth daily as needed for erectile dysfunction.     tamsulosin (FLOMAX) 0.4 MG CAPS capsule Take 0.4 mg by mouth 2 (two) times daily.     terbinafine (LAMISIL) 250 MG tablet Take 250 mg by mouth daily.     torsemide (DEMADEX) 20 MG tablet TAKE 1 TABLET (20 MG TOTAL) BY MOUTH 2 (TWO) TIMES DAILY. INSUR WILL PAY ON 02/21/19 180 tablet 3   valACYclovir (VALTREX) 500 MG tablet Take 500 mg by mouth See admin instructions. Take 4 tables by mouth ever 12 hours for 1 day     No current facility-administered medications on file prior to visit.    Allergies:   Allergies  Allergen Reactions   Morphine Nausea And Vomiting   Pseudoephedrine Hcl Rash    Physical Exam Today's Vitals   10/09/21 0801  BP: 126/73  Pulse: (!) 55  Weight: 293 lb (132.9 kg)  Height: 5\' 9"  (1.753 m)   Body mass index is 43.27 kg/m.  General: Obese very pleasant middle-aged Caucasian male, seated, in no evident distress Head: head normocephalic and atraumatic.   Neck: supple with no carotid or supraclavicular bruits Cardiovascular: regular rate and rhythm, no murmurs Musculoskeletal: no deformity Skin:  no rash/but Vascular:  Normal pulses all extremities  Neurologic Exam Mental Status: Awake and fully alert.  Fluent  speech and language.  Oriented to place and time. Recent and remote memory intact. Attention span, concentration and fund of  knowledge appropriate. Mood and affect appropriate.  Cranial Nerves: Pupils equal, briskly reactive to light. Extraocular movements full without nystagmus. Visual fields show only partial right upper visual field defect to confrontation. Hearing intact. Facial sensation intact. Face, tongue, palate moves normally and symmetrically.  Motor: Normal bulk and tone. Normal strength in all tested extremity muscles.  Sensory.: intact to touch , pinprick , position and vibratory sensation.  Coordination: Rapid alternating movements normal in all extremities. Finger-to-nose and heel-to-shin performed accurately bilaterally. Gait and Station: Arises from chair without difficulty. Stance is normal. Gait demonstrates normal stride length and balance .  Walks with a cane with slight favoring of the right leg \ Reflexes: 1+ and symmetric. Toes downgoing.       ASSESSMENT/PLAN: 66 year old Caucasian male with embolic right frontal MCA branch infarcts and right retinal artery branch occlusion of cryptogenic etiology in December 2022.  Recurrent right MCA branch embolic infarcts in January 2023 as well of cryptogenic etiology.  Vascular risk factors of new dx of A fib now on Eliquis 06/2021 per ILR, hypertension, hyperlipidemia, diabetes, obesity and diagnosed with sleep apnea 06/2021 and initiated CPAP 08/2021.    1.  Recurrent right MCA strokes -s/p ILR 04/2021 with evidence of A-fib 5/22, CHA2DS2-VASc score 6, started on Eliquis 5 mg BID, continue to follow with cardiology -Continue Eliquis, atorvastatin 80 mg daily and Zetia 10 mg daily for secondary stroke prevention managed by PCP.  No indication for continued use of aspirin from stroke standpoint, advised to f/u with cardiology for further discussion  -Continue close PCP follow-up for aggressive stroke risk factor management including BP  goal<130/90, HLD with LDL goal<70 and DM with A1c.<7   2.  OSA on CPAP -Compliance report shows satisfactory usage although AHI remains slightly elevated at 7.7.  will slightly increase pressure from 7-15 to 7-16.  Also recommend mask refitting d/t high leak rate to be contributing.  Will reassess download in 1 month to see if any improvement. Discussed continued nightly usage with ensuring greater than 4 hours nightly for optimal benefit and per insurance purposes.  Continue to follow with DME company for any needed supplies or CPAP related concerns  ADDENDUM 12/11/2021: Repeat download over the past 30 days at pressure setting of 8-18 shows residual AHI 7.5 with increased leak rate in the 95th percentile at 68. Will decrease pressure to 7-17 and repeat in 1 month. If leak rate remains elevated, will have patient f/u with DME company for mask refitting as this elevated leak could be due to recent increase in pressure.   ADDENDUM 11/09/2021: Repeat download report after pressure setting changes show continued slightly elevated AHI at 6.8 although improved since prior.  Recommend further slight increase to pressure settings from 7-16 to 8-18. Will repeat download in 1 month.    3. Chronic headaches  -improves after eating  -recommend checking glucose level with headache onset  -recommend eating small frequent meals every 2-3 hours  -if headaches persist or worsen and he would like to try a prophylactic management, he was advised to call office      Follow-up in 6 months or call earlier if needed    I spent 36 minutes of face-to-face and non-face-to-face time with patient and wife.  This included previsit chart review, lab review, study review, order entry, electronic health record documentation, patient education and discussion regarding above diagnoses and treatment plan and answered all questions to patient wife satisfaction  Frann Rider, AGNP-BC  [facility] [phone]  [place],  [address]  Phone [phone] Fax [phone] Note: This document was prepared with digital dictation and possible smart phrase technology. Any transcriptional errors that result from this process are unintentional.

## 2021-10-09 ENCOUNTER — Ambulatory Visit: Payer: Managed Care, Other (non HMO) | Admitting: Adult Health

## 2021-10-09 ENCOUNTER — Encounter: Payer: Self-pay | Admitting: Adult Health

## 2021-10-09 VITALS — BP 126/73 | HR 55 | Ht 69.0 in | Wt 293.0 lb

## 2021-10-09 DIAGNOSIS — I63411 Cerebral infarction due to embolism of right middle cerebral artery: Secondary | ICD-10-CM

## 2021-10-09 DIAGNOSIS — G8929 Other chronic pain: Secondary | ICD-10-CM

## 2021-10-09 DIAGNOSIS — R519 Headache, unspecified: Secondary | ICD-10-CM

## 2021-10-09 DIAGNOSIS — G4731 Primary central sleep apnea: Secondary | ICD-10-CM

## 2021-10-09 DIAGNOSIS — Z9989 Dependence on other enabling machines and devices: Secondary | ICD-10-CM | POA: Diagnosis not present

## 2021-10-09 NOTE — Progress Notes (Signed)
Carelink Summary Report / Loop Recorder 

## 2021-10-09 NOTE — Progress Notes (Signed)
Faxed mask refit order and office notes to [facility], received confirmation.

## 2021-10-09 NOTE — Patient Instructions (Addendum)
Your Plan:  Continue nightly use of CPAP, Will increase pressure slightly to see if this helps with residual amount of apneas. I will recheck a download in about 1 month to see if any improvement. Continue to follow with your DME company for any supplies or CPAP related concerns  Continue to follow closely with PCP for aggressive stroke risk factor management  Continue to follow with cardiology for Eliquis and atrial fibrillation management, please further discuss ongoing need of aspirin with their office. Ongoing use of aspirin is not needed from a stroke standpoint     Follow up in 6 months or call earlier if needed      Thank you for coming to see [place] at [facility]. I hope we have been able to provide you high quality care today.  You may receive a patient satisfaction survey over the next few weeks. We would appreciate your feedback and comments so that we may continue to improve ourselves and the health of our patients.

## 2021-10-30 ENCOUNTER — Ambulatory Visit (INDEPENDENT_AMBULATORY_CARE_PROVIDER_SITE_OTHER): Payer: Managed Care, Other (non HMO)

## 2021-10-30 ENCOUNTER — Telehealth: Payer: Self-pay

## 2021-10-30 DIAGNOSIS — I639 Cerebral infarction, unspecified: Secondary | ICD-10-CM

## 2021-10-30 NOTE — Telephone Encounter (Signed)
   Pre-operative Risk Assessment    Patient Name: Kirk Frazier  DOB: 21-Jan-1955 MRN: [id number]      Request for Surgical Clearance    Procedure:   TKA  Date of Surgery:  Clearance TBD                                 Surgeon:  Lara Mulch, MD  Surgeon's Group or Practice Name:  [facility] Phone number:  [phone] Fax number:  [phone]   Type of Clearance Requested:   - Medical    Type of Anesthesia:  Spinal   Additional requests/questions:   Fax form and pertinent records  to office fax # [phone] and [facility] Fax # [phone]  Signed, Toni Arthurs   10/30/2021, 4:48 PM

## 2021-10-31 LAB — CUP PACEART REMOTE DEVICE CHECK
Date Time Interrogation Session: 20231029230159
Implantable Pulse Generator Implant Date: 20230403

## 2021-10-31 NOTE — Telephone Encounter (Signed)
Please review Eliquis hold for TKA.

## 2021-11-01 NOTE — Telephone Encounter (Signed)
   Name: Kirk Frazier  DOB: [birth year]  MRN: [id number]  Primary Cardiologist: None  Chart reviewed as part of pre-operative protocol coverage. Because of Caspian Deleonardis past medical history and time since last visit, he will require a follow-up telephone visit in order to better assess preoperative cardiovascular risk.  Pre-op covering staff: - Please schedule appointment and call patient to inform them. If patient already had an upcoming appointment within acceptable timeframe, please add "pre-op clearance" to the appointment notes so provider is aware. - Please contact requesting surgeon's office via preferred method (i.e, phone, fax) to inform them of need for appointment prior to surgery.  Per office protocol, patient can hold Eliquis for 3 days prior to procedure.  (Required 3 day hold for spinal anesthesia) No bridging with lovenox needed.   Elgie Collard, PA-C  11/01/2021, 1:32 PM

## 2021-11-01 NOTE — Telephone Encounter (Signed)
Pt has appt with Dr. Bettina Gavia 11/14/21. Will add need pre op clearance as well. I will update the requesting office the pt has appt.

## 2021-11-01 NOTE — Telephone Encounter (Signed)
Patient with diagnosis of atrial fibrillation on Eliquis for anticoagulation.    Procedure: TKA Date of procedure: TBD   CHA2DS2-VASc Score = 7   This indicates a 11.2% annual risk of stroke. The patient's score is based upon: CHF History: 1 HTN History: 1 Diabetes History: 1 Stroke History: 2 Vascular Disease History: 1 Age Score: 1 Gender Score: 0    CrCl 85 (with adjusted body weight) Platelet count 174  Per office protocol, patient can hold Eliquis for 3 days prior to procedure.  (Required 3 day hold for spinal anesthesia)  Will need to review with primary cardiologist on need for Lovenox bridge.  Per chart multiple CVA with most recent 12/2020  **This guidance is not considered finalized until pre-operative APP has relayed final recommendations.**

## 2021-11-08 ENCOUNTER — Telehealth: Payer: Self-pay | Admitting: *Deleted

## 2021-11-08 NOTE — Telephone Encounter (Signed)
NP's note printed, faxed with clearance form to [facility]. Received confirmation.

## 2021-11-08 NOTE — Telephone Encounter (Signed)
Received fax from [facility], re: surgical clearance form for TKA under spinal anesthesia. Placed on NP's desk for completion, signature.

## 2021-11-08 NOTE — Telephone Encounter (Signed)
Please provide with clearance form:  Patient has been stable without any recurrent stroke/TIAs since 01/2021.  He was found to have A-fib 06/2021 and currently on Eliquis and aspirin.  From a stroke standpoint, can hold Eliquis and aspirin prior to procedure with small but acceptable risk of preprocedural stroke while off therapy.  Recommend restart immediately after or once hemodynamically stable. Patient will need further clearance from cardiology standpoint if not already obtained.

## 2021-11-09 ENCOUNTER — Encounter: Payer: Self-pay | Admitting: Adult Health

## 2021-11-13 NOTE — Progress Notes (Unsigned)
Cardiology Office Note:    Date:  11/14/2021   ID:  Kirk Frazier, DOB 1955-01-29, MRN [id number]  PCP:  Algis Greenhouse, MD  Cardiologist:  Shirlee More, MD    Referring MD: Algis Greenhouse, MD    ASSESSMENT:    1. Preoperative cardiovascular examination   2. Paroxysmal atrial fibrillation (HCC)   3. Secondary hypercoagulable state ([place])   4. Cerebrovascular accident (CVA), unspecified mechanism ([place])   5. Hypertensive heart disease with chronic diastolic congestive heart failure ([place])   6. Coronary artery disease of native artery of native heart with stable angina pectoris ([place])   7. Hyperlipidemia, unspecified hyperlipidemia type   8. Obstructive sleep apnea   9. Type 2 diabetes mellitus without complication, without long-term current use of insulin ([place])   10. Morbidly obese (HCC)    PLAN:    In order of problems listed above:  His planned procedure is intermediate risk elective and he is optimized from a cardiology perspective.  His heart failure is compensated and he has stable CAD.  He can withdraw his anticoagulants 3 days equals 6 doses preoperatively and typically resume 48 hours afterwards at the discretion of orthopedic surgery.  He has significant comorbidities including obesity obstructive sleep apnea atrial fibrillation I think he would benefit from staying overnight in observation cardiac monitor EKG postoperative day 1 if any cardiac problems contact heart care to come and see him.  Continue his usual cardiac medications for the hospital stay. Stable maintaining sinus rhythm and compliant with his current anticoagulant. He has made a good functional recovery from his stroke and is on appropriate antithrombotic treatment Heart failure is much improved he is not edematous currently takes a diuretic as needed blood pressure at target continue his current loop diuretic beta-blocker Stable continue his high intensity statin and Zetia achieving LDL target He will need  close attention postoperative with his obstructive sleep apnea morbid obesity I asked him to discuss with his PCP injectable semaglutide or Mounjaro to assist with weight loss   Next appointment: 9 momths   Medication Adjustments/Labs and Tests Ordered: Current medicines are reviewed at length with the patient today.  Concerns regarding medicines are outlined above.  Orders Placed This Encounter  Procedures   EKG 12-Lead   No orders of the defined types were placed in this encounter.   Chief complaint pending total knee arthroplasty needs advice for perioperative cardiac optimization and management of anticoagulation   History of Present Illness:    Kirk Frazier is a 66 y.o. male with a hx of CAD with PCI and stent to the LAD in 2016 hypertensive heart disease with chronic diastolic heart failure recurrent stroke implanted loop recorder last seen 05/15/2021.  Compliance with diet, lifestyle and medications: Yes  Overall Kirk Frazier is doing much better his heart failure is better controlled he currently does not require loop diuretic. He had captured atrial fibrillation with a loop recorder tolerates his anticoagulant without muscle pain or weakness. Diabetes is well controlled last A1c 7.2% Despite need pain his exercise tolerance exceeds 4 METS He is not having edema shortness of breath chest pain palpitation or syncope Past Medical History:  Diagnosis Date   Benign hypertension 02/10/2015   CAD in native artery 07/07/2014   Overview:  PCI and DES to LCFx 01/08/14 for Troponin  Normal unstable angina   Chronic diastolic heart failure ([place]) 07/07/2014   Coronary artery disease 02/10/2015   Overview:  Managed CARDS 2016: CIRC 95% PCI Resolute stent LAD  40% EF 65%   Essential hypertension 07/07/2014   Gout 02/10/2015   Overview:  2017: sUA=9.6   Hemoglobinemia due to blood transfusion 05/31/2016   Overview:  1982   Hyperlipidemia 07/07/2014   Nephrolithiasis 02/10/2015    Post-traumatic osteoarthritis of both knees 03/22/2016   Rectal bleeding 02/28/2015   Rib pain on right side 05/31/2016   Overview:  2014: injury   Screening for colon cancer 05/30/2016   Screening for prostate cancer 05/30/2016   Stroke (HCC) 01/23/2021   Type 2 diabetes mellitus without complication, without long-term current use of insulin (HCC) 02/10/2015   Overview:  2016: 6.5 2017: 412 random with A1C 7.7, started MTF   Wellness examination 05/30/2016    Past Surgical History:  Procedure Laterality Date   BUBBLE STUDY  01/03/2021   Procedure: BUBBLE STUDY;  Surgeon: [name], MD;  Location: [facility] ENDOSCOPY;  Service: Cardiovascular;;   CARPAL TUNNEL RELEASE     CORONARY ANGIOPLASTY WITH STENT PLACEMENT     KNEE SURGERY     TEE WITHOUT CARDIOVERSION N/A 01/03/2021   Procedure: TRANSESOPHAGEAL ECHOCARDIOGRAM (TEE);  Surgeon: Elease Hashimoto Deloris Ping, MD;  Location: [facility] ENDOSCOPY;  Service: Cardiovascular;  Laterality: N/A;   TRIGGER FINGER RELEASE      Current Medications: Current Meds  Medication Sig   allopurinol (ZYLOPRIM) 300 MG tablet Take 300 mg by mouth daily.   aspirin EC 81 MG tablet Take 1 tablet (81 mg total) by mouth daily. Swallow whole.   atorvastatin (LIPITOR) 80 MG tablet Take 1 tablet (80 mg total) by mouth daily.   Colchicine 0.6 MG CAPS Take 0.6 mg by mouth daily as needed for other (Gout).   ELIQUIS 5 MG TABS tablet TAKE 1 TABLET BY MOUTH TWICE A DAY   ezetimibe (ZETIA) 10 MG tablet Take 10 mg by mouth daily.   FARXIGA 10 MG TABS tablet Take 10 mg by mouth every morning.   fluorouracil (EFUDEX) 5 % cream Apply 1 application topically daily as needed (Skin rash).   Ivermectin 1 % CREA Apply 1 application topically daily as needed (Skin cancers).   metFORMIN (GLUCOPHAGE-XR) 500 MG 24 hr tablet Take 1,000 mg by mouth 2 (two) times daily.    metoprolol tartrate (LOPRESSOR) 25 MG tablet Take 1 tablet (25 mg total) by mouth 2 (two) times daily.   nitroGLYCERIN  (NITROSTAT) 0.4 MG SL tablet PLACE 1 TABLET UNDER THE TONGUE EVERY FIVE MINUTES AS NEEDED FOR CHEST PAIN.   potassium chloride (KLOR-CON) 10 MEQ tablet TAKE 1 TABLET BY MOUTH EVERY DAY   RYBELSUS 7 MG TABS Take 0.5 tablets by mouth daily.   tadalafil (CIALIS) 10 MG tablet Take 10 mg by mouth daily as needed for erectile dysfunction.   tamsulosin (FLOMAX) 0.4 MG CAPS capsule Take 0.4 mg by mouth 2 (two) times daily.   terbinafine (LAMISIL) 250 MG tablet Take 250 mg by mouth daily.   torsemide (DEMADEX) 20 MG tablet TAKE 1 TABLET (20 MG TOTAL) BY MOUTH 2 (TWO) TIMES DAILY. INSUR WILL PAY ON 02/21/19   valACYclovir (VALTREX) 500 MG tablet Take 500 mg by mouth See admin instructions. Take 4 tables by mouth ever 12 hours for 1 day     Allergies:   Morphine and Pseudoephedrine hcl   Social History   Socioeconomic History   Marital status: Married    Spouse name: Not on file   Number of children: Not on file   Years of education: Not on file   Highest education level:  Associate degree: academic program  Occupational History   Not on file  Tobacco Use   Smoking status: Former    Passive exposure: Past   Smokeless tobacco: Never   Tobacco comments:    Former smoker 05/24/21  Vaping Use   Vaping Use: Never used  Substance and Sexual Activity   Alcohol use: No   Drug use: No   Sexual activity: Not on file  Other Topics Concern   Not on file  Social History Narrative   Right handed    Caffeine- 2 cups of coffee per day    Live at home with wife    Social Determinants of Health   Financial Resource Strain: Not on file  Food Insecurity: Not on file  Transportation Needs: Not on file  Physical Activity: Not on file  Stress: Not on file  Social Connections: Not on file     Family History: The patient's family history includes Cancer in his father; Diabetes in his brother and father; Heart attack in his brother; Heart failure in his father and mother; Hypertension in his mother;  Thyroid disease in his sister. ROS:   Please see the history of present illness.    All other systems reviewed and are negative.  EKGs/Labs/Other Studies Reviewed:    The following studies were reviewed today:  EKG:  EKG ordered today and personally reviewed.  The ekg ordered today demonstrates sinus rhythm first-degree AV block otherwise normal EKG  Recent Labs: 01/23/2021: ALT 18 05/15/2021: NT-Pro BNP 76 06/29/2021: BUN 21; Creatinine, Ser 1.17; Hemoglobin 16.1; Platelets 212; Potassium 4.4; Sodium 140  Recent Lipid Panel    Component Value Date/Time   CHOL 116 06/26/2019 0918   TRIG 114 06/26/2019 0918   HDL 40 06/26/2019 0918   CHOLHDL 2.9 06/26/2019 0918   LDLCALC 55 06/26/2019 0918    Physical Exam:    VS:  BP 128/76 (BP Location: Right Arm, Patient Position: Sitting, Cuff Size: Large)   Pulse (!) 56   Ht 5\' 9"  (1.753 m)   Wt 291 lb 3.2 oz (132.1 kg)   SpO2 99%   BMI 43.00 kg/m     Wt Readings from Last 3 Encounters:  11/14/21 291 lb 3.2 oz (132.1 kg)  10/09/21 293 lb (132.9 kg)  06/29/21 281 lb (127.5 kg)     GEN: Obese BMI 43 well nourished, well developed in no acute distress HEENT: Normal NECK: No JVD; No carotid bruits LYMPHATICS: No lymphadenopathy CARDIAC: RRR, no murmurs, rubs, gallops RESPIRATORY:  Clear to auscultation without rales, wheezing or rhonchi  ABDOMEN: Soft, non-tender, non-distended MUSCULOSKELETAL:  No edema; No deformity  SKIN: Warm and dry NEUROLOGIC:  Alert and oriented x 3 PSYCHIATRIC:  Normal affect    Signed, Shirlee More, MD  11/14/2021 4:22 PM    [facility]

## 2021-11-14 ENCOUNTER — Encounter: Payer: Self-pay | Admitting: Cardiology

## 2021-11-14 ENCOUNTER — Ambulatory Visit: Payer: Managed Care, Other (non HMO) | Attending: Cardiology | Admitting: Cardiology

## 2021-11-14 VITALS — BP 128/76 | HR 56 | Ht 69.0 in | Wt 291.2 lb

## 2021-11-14 DIAGNOSIS — I639 Cerebral infarction, unspecified: Secondary | ICD-10-CM | POA: Diagnosis not present

## 2021-11-14 DIAGNOSIS — I480 Paroxysmal atrial fibrillation: Secondary | ICD-10-CM | POA: Diagnosis not present

## 2021-11-14 DIAGNOSIS — D6869 Other thrombophilia: Secondary | ICD-10-CM | POA: Diagnosis not present

## 2021-11-14 DIAGNOSIS — Z01810 Encounter for preprocedural cardiovascular examination: Secondary | ICD-10-CM | POA: Diagnosis not present

## 2021-11-14 DIAGNOSIS — E119 Type 2 diabetes mellitus without complications: Secondary | ICD-10-CM

## 2021-11-14 DIAGNOSIS — E785 Hyperlipidemia, unspecified: Secondary | ICD-10-CM

## 2021-11-14 DIAGNOSIS — I25118 Atherosclerotic heart disease of native coronary artery with other forms of angina pectoris: Secondary | ICD-10-CM

## 2021-11-14 DIAGNOSIS — I5032 Chronic diastolic (congestive) heart failure: Secondary | ICD-10-CM

## 2021-11-14 DIAGNOSIS — G4733 Obstructive sleep apnea (adult) (pediatric): Secondary | ICD-10-CM

## 2021-11-14 DIAGNOSIS — I110 Hypertensive heart disease with heart failure: Secondary | ICD-10-CM

## 2021-11-14 NOTE — Patient Instructions (Signed)

## 2021-11-23 ENCOUNTER — Other Ambulatory Visit: Payer: Self-pay | Admitting: Cardiology

## 2021-11-27 NOTE — Telephone Encounter (Signed)
Rx refill sent to pharmacy. 

## 2021-12-02 NOTE — Progress Notes (Signed)
Carelink Summary Report / Loop Recorder 

## 2021-12-04 ENCOUNTER — Ambulatory Visit: Payer: Managed Care, Other (non HMO) | Attending: Cardiology

## 2021-12-04 DIAGNOSIS — I639 Cerebral infarction, unspecified: Secondary | ICD-10-CM | POA: Diagnosis not present

## 2021-12-04 LAB — CUP PACEART REMOTE DEVICE CHECK
Date Time Interrogation Session: 20231203230725
Implantable Pulse Generator Implant Date: 20230403

## 2021-12-14 ENCOUNTER — Telehealth: Payer: Self-pay | Admitting: Cardiology

## 2021-12-14 NOTE — Telephone Encounter (Signed)
Patient is following up on the status of his clearance paperwork.

## 2021-12-14 NOTE — Telephone Encounter (Signed)
Spoke with surgery scheduler Toni Amend) and she has received clearance

## 2022-01-08 ENCOUNTER — Ambulatory Visit (INDEPENDENT_AMBULATORY_CARE_PROVIDER_SITE_OTHER): Payer: Self-pay

## 2022-01-08 DIAGNOSIS — I639 Cerebral infarction, unspecified: Secondary | ICD-10-CM

## 2022-01-09 LAB — CUP PACEART REMOTE DEVICE CHECK
Date Time Interrogation Session: 20240107231047
Implantable Pulse Generator Implant Date: 20230403

## 2022-01-11 ENCOUNTER — Encounter (HOSPITAL_COMMUNITY): Payer: Self-pay | Admitting: Physician Assistant

## 2022-01-11 ENCOUNTER — Ambulatory Visit (HOSPITAL_COMMUNITY)
Admission: RE | Admit: 2022-01-11 | Discharge: 2022-01-11 | Disposition: A | Discharge status: Home / Self Care | Payer: Medicare Other | Source: Ambulatory Visit | Attending: Physician Assistant | Admitting: Physician Assistant

## 2022-01-11 VITALS — BP 116/76 | HR 66 | Ht 69.0 in | Wt 289.0 lb

## 2022-01-11 DIAGNOSIS — I2510 Atherosclerotic heart disease of native coronary artery without angina pectoris: Secondary | ICD-10-CM | POA: Diagnosis not present

## 2022-01-11 DIAGNOSIS — Z7901 Long term (current) use of anticoagulants: Secondary | ICD-10-CM | POA: Insufficient documentation

## 2022-01-11 DIAGNOSIS — E785 Hyperlipidemia, unspecified: Secondary | ICD-10-CM | POA: Diagnosis not present

## 2022-01-11 DIAGNOSIS — I639 Cerebral infarction, unspecified: Secondary | ICD-10-CM | POA: Diagnosis not present

## 2022-01-11 DIAGNOSIS — G4731 Primary central sleep apnea: Secondary | ICD-10-CM | POA: Diagnosis not present

## 2022-01-11 DIAGNOSIS — I480 Paroxysmal atrial fibrillation: Secondary | ICD-10-CM | POA: Diagnosis present

## 2022-01-11 DIAGNOSIS — E669 Obesity, unspecified: Secondary | ICD-10-CM | POA: Insufficient documentation

## 2022-01-11 DIAGNOSIS — I10 Essential (primary) hypertension: Secondary | ICD-10-CM | POA: Insufficient documentation

## 2022-01-11 DIAGNOSIS — Z6841 Body Mass Index (BMI) 40.0 and over, adult: Secondary | ICD-10-CM | POA: Diagnosis not present

## 2022-01-11 DIAGNOSIS — E119 Type 2 diabetes mellitus without complications: Secondary | ICD-10-CM | POA: Insufficient documentation

## 2022-01-11 DIAGNOSIS — D6869 Other thrombophilia: Secondary | ICD-10-CM | POA: Diagnosis not present

## 2022-01-11 NOTE — Progress Notes (Signed)
Carelink Summary Report / Loop Recorder 

## 2022-01-11 NOTE — Progress Notes (Signed)
Primary Care Physician: Olive Bass, MD Primary Cardiologist: Dr Dulce Sellar Primary Electrophysiologist: Dr Elberta Fortis Referring Physician: Dr Kathrine Haddock is a 67 y.o. male with a history of CAD, HTN, HLD, DM, CVA, atrial fibrillation who presents for follow up in the [facility]rial [facility]ibrillation [facility]. He was seen at [facility] 01/23/2021 with recurrent stroke symptoms. He wore a 30-day monitor that showed no evidence of atrial fibrillation. He was seen by Dr Elberta Fortis on 04/03/21 and an ILR was placed for long term monitoring. The device clinic received an alert on 05/22/21 for an afib episode lasting 3 hours and 20 minutes. Patient has a CHADS2VASC score of 6. He was asymptomatic during the episode. He denies alcohol use but does snore. He has an appointment with neurology to discuss sleep study.   On follow up today, patient reports that he has done well since his last visit. ILR shows 0% afib burden. No bleeding issues on anticoagulation.   Today, he denies symptoms of palpitations, chest pain, shortness of breath, orthopnea, PND, lower extremity edema, dizziness, presyncope, syncope, bleeding, or neurologic sequela. The patient is tolerating medications without difficulties and is otherwise without complaint today.    Atrial Fibrillation Risk Factors:  he does have symptoms or diagnosis of sleep apnea. he is is compliant with CPAP therapy.  he does not have a history of rheumatic fever. he does not have a history of alcohol use. The patient does have a history of early familial atrial fibrillation or other arrhythmias. Father had afib.  he has a BMI of Body mass index is 42.68 kg/m.Marland Kitchen Filed Weights   01/11/22 1346  Weight: 131.1 kg    Family History  Problem Relation Age of Onset   Hypertension Mother    Heart failure Mother    Cancer Father    Heart failure Father    Diabetes Father    Thyroid disease Sister    Heart attack Brother    Diabetes  Brother      Atrial Fibrillation Management history:  Previous antiarrhythmic drugs: none Previous cardioversions: none Previous ablations: none CHADS2VASC score: 6 Anticoagulation history: Eliquis   Past Medical History:  Diagnosis Date   Benign hypertension 02/10/2015   CAD in native artery 07/07/2014   Overview:  PCI and DES to LCFx 01/08/14 for Troponin  Normal unstable angina   Chronic diastolic heart failure (HCC) 07/07/2014   Coronary artery disease 02/10/2015   Overview:  Managed CARDS 2016: CIRC 95% PCI Resolute stent LAD 40% EF 65%   Essential hypertension 07/07/2014   Gout 02/10/2015   Overview:  2017: sUA=9.6   Hemoglobinemia due to blood transfusion 05/31/2016   Overview:  1982   Hyperlipidemia 07/07/2014   Nephrolithiasis 02/10/2015   Post-traumatic osteoarthritis of both knees 03/22/2016   Rectal bleeding 02/28/2015   Rib pain on right side 05/31/2016   Overview:  2014: injury   Screening for colon cancer 05/30/2016   Screening for prostate cancer 05/30/2016   Stroke (HCC) 01/23/2021   Type 2 diabetes mellitus without complication, without long-term current use of insulin (HCC) 02/10/2015   Overview:  2016: 6.5 2017: 412 random with A1C 7.7, started MTF   Wellness examination 05/30/2016   Past Surgical History:  Procedure Laterality Date   BUBBLE STUDY  01/03/2021   Procedure: BUBBLE STUDY;  Surgeon: [name], MD;  Location: [facility] ENDOSCOPY;  Service: Cardiovascular;;   CARPAL TUNNEL RELEASE     CORONARY ANGIOPLASTY WITH STENT PLACEMENT  KNEE SURGERY     TEE WITHOUT CARDIOVERSION N/A 01/03/2021   Procedure: TRANSESOPHAGEAL ECHOCARDIOGRAM (TEE);  Surgeon: Acie Fredrickson Wonda Cheng, MD;  Location: [facility] ENDOSCOPY;  Service: Cardiovascular;  Laterality: N/A;   TRIGGER FINGER RELEASE      Current Outpatient Medications  Medication Sig Dispense Refill   allopurinol (ZYLOPRIM) 300 MG tablet Take 300 mg by mouth daily.     aspirin EC 81 MG tablet Take 1 tablet  (81 mg total) by mouth daily. Swallow whole. 90 tablet 3   atorvastatin (LIPITOR) 80 MG tablet Take 1 tablet (80 mg total) by mouth daily. 90 tablet 2   Colchicine 0.6 MG CAPS Take 0.6 mg by mouth daily as needed for other (Gout).     ELIQUIS 5 MG TABS tablet TAKE 1 TABLET BY MOUTH TWICE A DAY 60 tablet 11   ezetimibe (ZETIA) 10 MG tablet Take 10 mg by mouth daily.     FARXIGA 10 MG TABS tablet Take 10 mg by mouth every morning.     fluorouracil (EFUDEX) 5 % cream Apply 1 application topically daily as needed (Skin rash).     Ivermectin 1 % CREA Apply 1 application topically daily as needed (Skin cancers).     metFORMIN (GLUCOPHAGE-XR) 500 MG 24 hr tablet Take 1,000 mg by mouth 2 (two) times daily.      metoprolol tartrate (LOPRESSOR) 25 MG tablet Take 1 tablet (25 mg total) by mouth 2 (two) times daily. 180 tablet 3   nitroGLYCERIN (NITROSTAT) 0.4 MG SL tablet PLACE 1 TABLET UNDER THE TONGUE EVERY FIVE MINUTES AS NEEDED FOR CHEST PAIN. 25 tablet 3   potassium chloride (KLOR-CON) 10 MEQ tablet TAKE 1 TABLET BY MOUTH EVERY DAY 90 tablet 3   RYBELSUS 7 MG TABS Take 0.5 tablets by mouth daily.     tadalafil (CIALIS) 10 MG tablet Take 10 mg by mouth daily as needed for erectile dysfunction.     tamsulosin (FLOMAX) 0.4 MG CAPS capsule Take 0.4 mg by mouth 2 (two) times daily.     terbinafine (LAMISIL) 250 MG tablet Take 250 mg by mouth daily.     torsemide (DEMADEX) 20 MG tablet TAKE 1 TABLET (20 MG TOTAL) BY MOUTH 2 (TWO) TIMES DAILY. INSUR WILL PAY ON 02/21/19 180 tablet 3   valACYclovir (VALTREX) 500 MG tablet Take 500 mg by mouth See admin instructions. Take 4 tables by mouth ever 12 hours for 1 day     No current facility-administered medications for this encounter.    Allergies  Allergen Reactions   Morphine Nausea And Vomiting   Pseudoephedrine Hcl Rash    Social History   Socioeconomic History   Marital status: Married    Spouse name: Not on file   Number of children: Not on file    Years of education: Not on file   Highest education level: Associate degree: academic program  Occupational History   Not on file  Tobacco Use   Smoking status: Former    Passive exposure: Past   Smokeless tobacco: Never   Tobacco comments:    Former smoker 05/24/21  Vaping Use   Vaping Use: Never used  Substance and Sexual Activity   Alcohol use: No   Drug use: No   Sexual activity: Not on file  Other Topics Concern   Not on file  Social History Narrative   Right handed    Caffeine- 2 cups of coffee per day    Live at home with wife  Social Determinants of Health   Financial Resource Strain: Not on file  Food Insecurity: Not on file  Transportation Needs: Not on file  Physical Activity: Not on file  Stress: Not on file  Social Connections: Not on file  Intimate Partner Violence: Not on file     ROS- All systems are reviewed and negative except as per the HPI above.  Physical Exam: Vitals:   01/11/22 1346  BP: 116/76  Pulse: 66  Weight: 131.1 kg  Height: 5\' 9"  (1.753 m)    GEN- The patient is a well appearing obese male, alert and oriented x 3 today.   HEENT-head normocephalic, atraumatic, sclera clear, conjunctiva pink, hearing intact, trachea midline. Lungs- Clear to ausculation bilaterally, normal work of breathing Heart- Regular rate and rhythm, no murmurs, rubs or gallops  GI- soft, NT, ND, + BS Extremities- no clubbing, cyanosis, or edema MS- no significant deformity or atrophy Skin- no rash or lesion Psych- euthymic mood, full affect Neuro- strength and sensation are intact   Wt Readings from Last 3 Encounters:  01/11/22 131.1 kg  11/14/21 132.1 kg  10/09/21 132.9 kg    EKG today demonstrates  SR, 1st degree AV block Vent. rate 66 BPM PR interval 214 ms QRS duration 98 ms QT/QTcB 412/431 ms  Echo 01/03/21 demonstrated   1. Left ventricular ejection fraction, by estimation, is 60 to 65%. The  left ventricle has normal function.   2.  Right ventricular systolic function is normal. The right ventricular  size is normal.   3. No left atrial/left atrial appendage thrombus was detected.   4. The mitral valve is normal in structure. No evidence of mitral valve regurgitation.   5. The aortic valve is normal in structure. Aortic valve regurgitation is not visualized. No aortic stenosis is present.   6. Agitated saline contrast bubble study was negative, with no evidence of any interatrial shunt.   Epic records are reviewed at length today  CHA2DS2-VASc Score = 7  The patient's score is based upon: CHF History: 1 HTN History: 1 Diabetes History: 1 Stroke History: 2 Vascular Disease History: 1 Age Score: 1 Gender Score: 0       ASSESSMENT AND PLAN: 1. Paroxysmal Atrial Fibrillation (ICD10:  I48.0) The patient's CHA2DS2-VASc score is 7, indicating a 11.2% annual risk of stroke.   ILR shows 0% afib burden Continue Eliquis 5 mg BID Continue Lopressor 25 mg BID  2. Secondary Hypercoagulable State (ICD10:  D68.69) The patient is at significant risk for stroke/thromboembolism based upon his CHA2DS2-VASc Score of 7.  Continue Apixaban (Eliquis).   3. Obesity Body mass index is 42.68 kg/m. Lifestyle modification was discussed and encouraged including regular physical activity and weight reduction.  4. CAD No anginal symptoms.  5. HTN Stable, no changes today.  6. Central sleep apnea Encouraged compliance with CPAP therapy. Followed at [facility]   Follow up in the AF clinic in one year.    [facility] [address] [place], [place] [address] [phone] 01/11/2022 2:55 PM

## 2022-01-29 ENCOUNTER — Other Ambulatory Visit (HOSPITAL_COMMUNITY): Payer: Self-pay | Admitting: *Deleted

## 2022-01-29 MED ORDER — APIXABAN 5 MG PO TABS: 5.0000 mg | ORAL_TABLET | Freq: Two times a day (BID) | ORAL | 2 refills | Status: DC

## 2022-01-29 MED ORDER — APIXABAN 5 MG PO TABS: 5.0000 mg | ORAL_TABLET | Freq: Two times a day (BID) | ORAL | 0 refills | Status: DC

## 2022-02-08 ENCOUNTER — Telehealth: Payer: Self-pay | Admitting: Adult Health

## 2022-02-08 ENCOUNTER — Encounter (HOSPITAL_COMMUNITY): Payer: Self-pay | Admitting: *Deleted

## 2022-02-08 NOTE — Telephone Encounter (Signed)
Noted, pharmacy has been updated.

## 2022-02-08 NOTE — Telephone Encounter (Signed)
Received disability forms for patient from [facility], called and patient made $50 payment. Patient also stated he has switched his pharmacy to [facility] and all of his medications should go to this pharmacy. FYI

## 2022-02-11 LAB — CUP PACEART REMOTE DEVICE CHECK
Date Time Interrogation Session: 20240209230554
Implantable Pulse Generator Implant Date: 20230403

## 2022-02-12 ENCOUNTER — Ambulatory Visit: Payer: Medicare Other

## 2022-02-12 DIAGNOSIS — Z0289 Encounter for other administrative examinations: Secondary | ICD-10-CM

## 2022-02-12 DIAGNOSIS — I639 Cerebral infarction, unspecified: Secondary | ICD-10-CM | POA: Diagnosis not present

## 2022-02-12 NOTE — Progress Notes (Signed)
Carelink Summary Report / Loop Recorder 

## 2022-02-13 ENCOUNTER — Telehealth: Payer: Self-pay | Admitting: *Deleted

## 2022-02-13 DIAGNOSIS — Z0271 Encounter for disability determination: Secondary | ICD-10-CM

## 2022-02-13 NOTE — Telephone Encounter (Signed)
Pt [place] form/medical records faxed on 02/13/2022

## 2022-02-19 ENCOUNTER — Other Ambulatory Visit: Payer: Self-pay | Admitting: Cardiology

## 2022-02-19 NOTE — Telephone Encounter (Signed)
Atorvastatin 80 mg refills sent to  [id number] [place],  - [address]

## 2022-03-13 HISTORY — DX: Morbid (severe) obesity due to excess calories: E66.01

## 2022-03-19 ENCOUNTER — Ambulatory Visit (INDEPENDENT_AMBULATORY_CARE_PROVIDER_SITE_OTHER): Payer: Medicare Other

## 2022-03-19 DIAGNOSIS — I639 Cerebral infarction, unspecified: Secondary | ICD-10-CM

## 2022-03-20 LAB — CUP PACEART REMOTE DEVICE CHECK
Date Time Interrogation Session: 20240317231235
Implantable Pulse Generator Implant Date: 20230403

## 2022-03-28 NOTE — Progress Notes (Signed)
Carelink Summary Report / Loop Recorder 

## 2022-04-03 ENCOUNTER — Telehealth: Payer: Self-pay | Admitting: Adult Health

## 2022-04-03 NOTE — Telephone Encounter (Signed)
LVM and sent mychart msg informing pt of r/s needed for 4/9 appt- NP out.

## 2022-04-10 ENCOUNTER — Ambulatory Visit: Payer: Medicare Other | Admitting: Adult Health

## 2022-04-23 ENCOUNTER — Ambulatory Visit: Payer: Self-pay

## 2022-04-23 LAB — CUP PACEART REMOTE DEVICE CHECK
Date Time Interrogation Session: 20240419230338
Implantable Pulse Generator Implant Date: 20230403

## 2022-04-26 ENCOUNTER — Ambulatory Visit (INDEPENDENT_AMBULATORY_CARE_PROVIDER_SITE_OTHER): Payer: Medicare Other

## 2022-04-26 DIAGNOSIS — I639 Cerebral infarction, unspecified: Secondary | ICD-10-CM

## 2022-04-30 NOTE — Progress Notes (Signed)
Carelink Summary Report / Loop Recorder 

## 2022-05-22 NOTE — Progress Notes (Signed)
Carelink Summary Report / Loop Recorder 

## 2022-05-24 LAB — CUP PACEART REMOTE DEVICE CHECK
Date Time Interrogation Session: 20240522230150
Implantable Pulse Generator Implant Date: 20230403

## 2022-05-29 ENCOUNTER — Ambulatory Visit (INDEPENDENT_AMBULATORY_CARE_PROVIDER_SITE_OTHER): Payer: Medicare Other

## 2022-05-29 DIAGNOSIS — I639 Cerebral infarction, unspecified: Secondary | ICD-10-CM | POA: Diagnosis not present

## 2022-06-07 ENCOUNTER — Telehealth: Payer: Self-pay | Admitting: Cardiology

## 2022-06-07 MED ORDER — POTASSIUM CHLORIDE ER 10 MEQ PO TBCR: 10.0000 meq | EXTENDED_RELEASE_TABLET | Freq: Every day | ORAL | 1 refills | Status: DC

## 2022-06-07 NOTE — Telephone Encounter (Signed)
*  STAT* If patient is at the pharmacy, call can be transferred to refill team.   1. Which medications need to be refilled? (please list name of each medication and dose if known)   potassium chloride (KLOR-CON) 10 MEQ tablet   2. Which pharmacy/location (including street and city if local pharmacy) is medication to be sent to? [facility] - [place], [place] - [address]   3. Do they need a 30 day or 90 day supply? 90 day supply  Patient is completely out of medication.

## 2022-06-22 NOTE — Progress Notes (Signed)
Carelink Summary Report / Loop Recorder 

## 2022-06-26 LAB — CUP PACEART REMOTE DEVICE CHECK
Date Time Interrogation Session: 20240624230222
Implantable Pulse Generator Implant Date: 20230403

## 2022-06-28 ENCOUNTER — Ambulatory Visit: Payer: Medicare Other | Admitting: Neurology

## 2022-07-02 ENCOUNTER — Ambulatory Visit (INDEPENDENT_AMBULATORY_CARE_PROVIDER_SITE_OTHER): Payer: Medicare Other

## 2022-07-02 DIAGNOSIS — I639 Cerebral infarction, unspecified: Secondary | ICD-10-CM

## 2022-07-09 ENCOUNTER — Telehealth (HOSPITAL_COMMUNITY): Payer: Self-pay | Admitting: *Deleted

## 2022-07-09 ENCOUNTER — Telehealth: Payer: Self-pay

## 2022-07-09 NOTE — Telephone Encounter (Signed)
   Pre-operative Risk Assessment    Patient Name: Kirk Frazier  DOB: 1955-07-29 MRN: [id number]      Request for Surgical Clearance    Procedure:   right eye evisceration  Date of Surgery:  Clearance 07/23/22                                 Surgeon:  Dr. Jodi Mourning Surgeon's Group or Practice Name:  [facility] Phone number:  [phone] Fax number:  [phone]   Type of Clearance Requested:   - Pharmacy:  Hold Aspirin and Apixaban (Eliquis) Hold Aspirin 10 days prior to procedure and hold Eliquis 4 days prior to procedure   Type of Anesthesia:  Not Indicated   Additional requests/questions:    Merlene Laughter   07/09/2022, 1:53 PM

## 2022-07-09 NOTE — Telephone Encounter (Signed)
Patient with diagnosis of afib on Eliquis for anticoagulation.    Procedure: right eye evisceration  Date of procedure: 07/23/22   CHA2DS2-VASc Score = 7   This indicates a 11.2% annual risk of stroke. The patient's score is based upon: CHF History: 1 HTN History: 1 Diabetes History: 1 Stroke History: 2 Vascular Disease History: 1 Age Score: 1 Gender Score: 0      CrCl 84 ml/min  Patient with a hx of CVA. He was cleared by Dr. Dulce Sellar within the last year to hold Eliquis for 3 days prior to a procedure. MD is requesting a 4 day hold. Given the half life of Eliquis and patient's renal function, I do not think this is needed. Will send to Dr. Dulce Sellar for final say.   **This guidance is not considered finalized until pre-operative APP has relayed final recommendations.**

## 2022-07-09 NOTE — Telephone Encounter (Signed)
Please advise holding Eliquis prior to right eye evisceration scheduled for 07/23/2022.  Thank you!  DW

## 2022-07-09 NOTE — Telephone Encounter (Signed)
See clearance note started by pre-op pool

## 2022-07-09 NOTE — Telephone Encounter (Addendum)
Patient scheduled for Right Eye Evisceration on 7/22 by Dr Dreama Saa. Requesting clearance to hold aspirin for 10 days prior to procedure and hold Eliquis 4 days prior to surgery.  Can route response back to [employer] RN and will forward to office.

## 2022-07-11 ENCOUNTER — Telehealth: Payer: Self-pay | Admitting: Neurology

## 2022-07-11 NOTE — Telephone Encounter (Signed)
Fyi - Pt wants Dr. Pearlean Brownie to know that his right eye will be removed because it's dead caused from stroke 01/08/21. Dr. Threasa Heads is performing surgery at [facility] 07/23/2022

## 2022-07-11 NOTE — Telephone Encounter (Signed)
   Patient Name: Kirk Frazier  DOB: January 13, 1955 MRN: [id number]  Primary Cardiologist: None  Chart reviewed as part of pre-operative protocol coverage. Pre-op clearance already addressed by colleagues in earlier phone notes. To summarize recommendations:  - Per Dr. Dulce Sellar, patient may hold Eliquis for 2 days prior and up to 2 days post op.   No medical clearance was requested but patient was seen recently by Dr. Dulce Sellar back in November and more recently by the A-fib clinic in January 2024.   Will route this bundled recommendation to requesting provider via Epic fax function and remove from pre-op pool. Please call with questions.  Sharlene Dory, PA-C 07/11/2022, 12:07 PM

## 2022-07-11 NOTE — Telephone Encounter (Signed)
Per Dr. Dulce Sellar, patient may hold Eliquis for 2 days prior and up to 2 days post op.

## 2022-07-19 NOTE — Progress Notes (Signed)
Carelink Summary Report / Loop Recorder 

## 2022-07-31 ENCOUNTER — Other Ambulatory Visit (HOSPITAL_COMMUNITY): Payer: Self-pay | Admitting: Physician Assistant

## 2022-08-06 ENCOUNTER — Ambulatory Visit (INDEPENDENT_AMBULATORY_CARE_PROVIDER_SITE_OTHER): Payer: Medicare Other

## 2022-08-06 DIAGNOSIS — I639 Cerebral infarction, unspecified: Secondary | ICD-10-CM

## 2022-08-10 IMAGING — CT CT HEAD W/O CM
4 series · 16 of 47 positions shown, 18 images · non-contrast
Comparison: December 11, 2020.

CLINICAL DATA: A 65-year-old male presents for evaluation of TIA,
LEFT facial numbness.



[Series 3: head wo · axial · 0.46mm/px · z∈[-80,+40]mm · 7 of 34 slices shown, 9 images]
[im 5/34  brain]
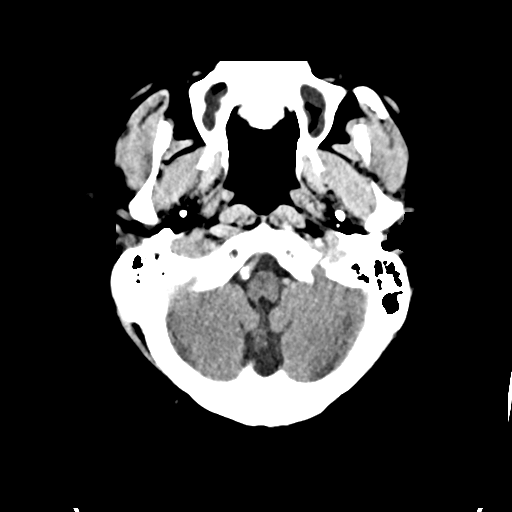
[im 5/34  bone]
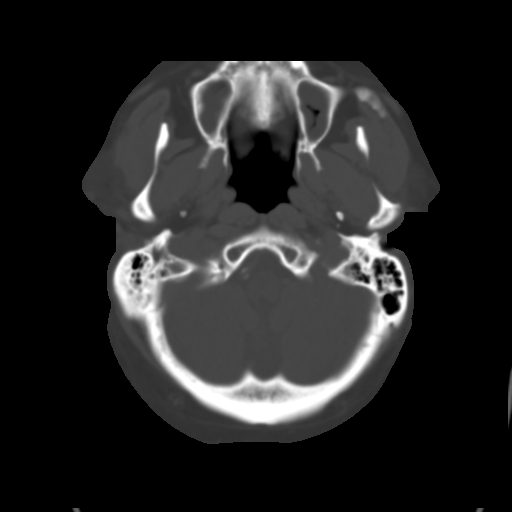
[im 9/34  brain]
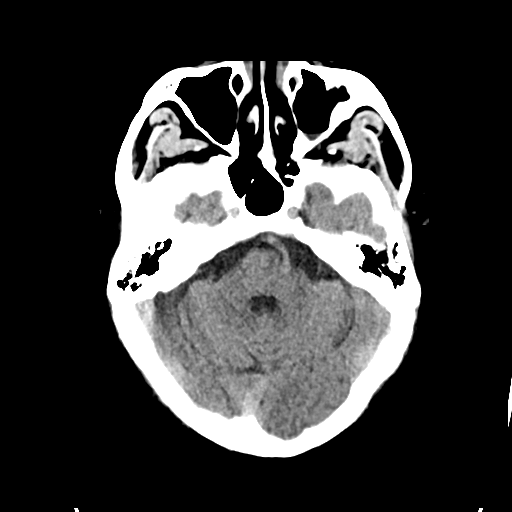
[im 13/34  brain]
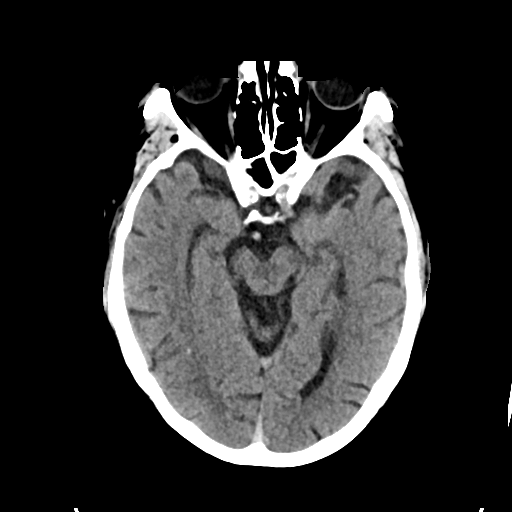
[im 17/34  brain]
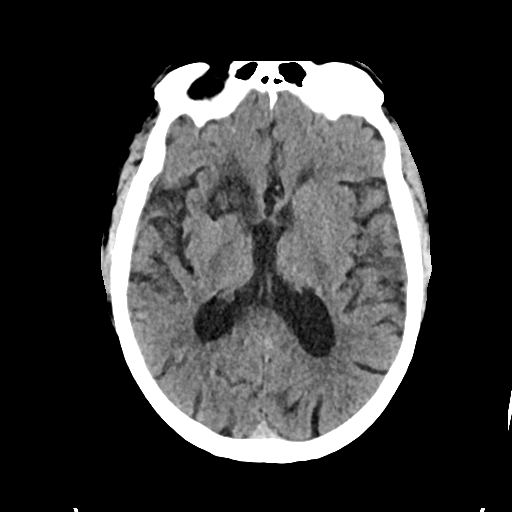
[im 21/34  brain]
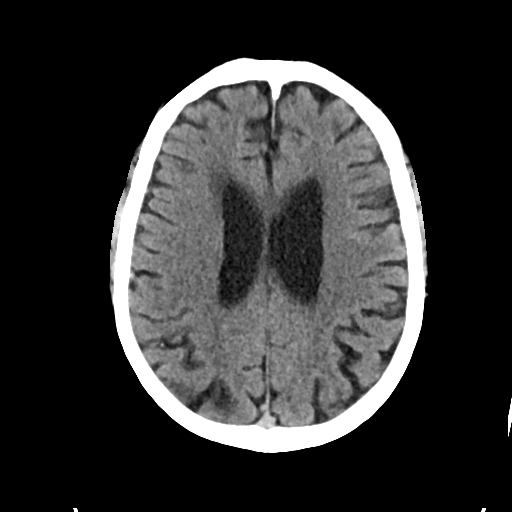
[im 21/34  bone]
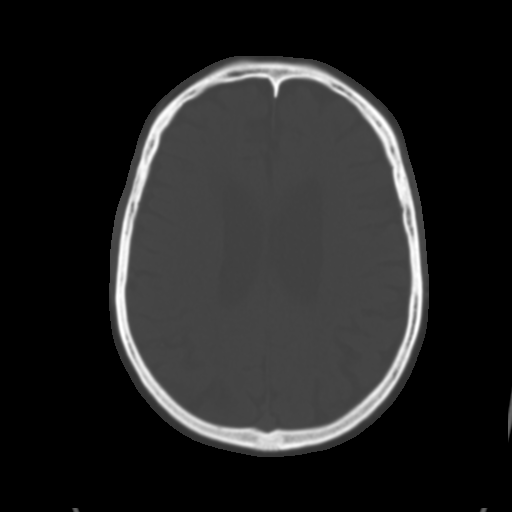
[im 25/34  brain]
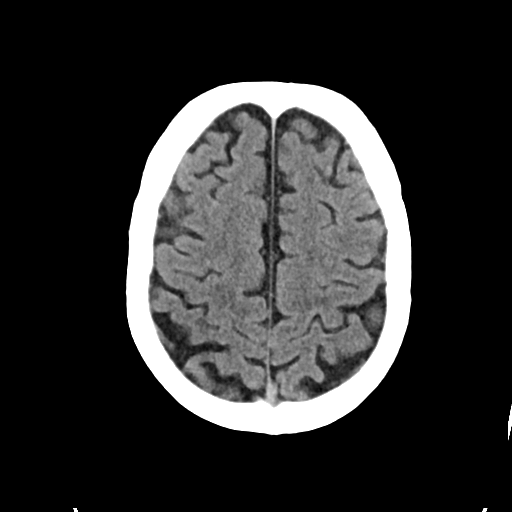
[im 29/34  brain]
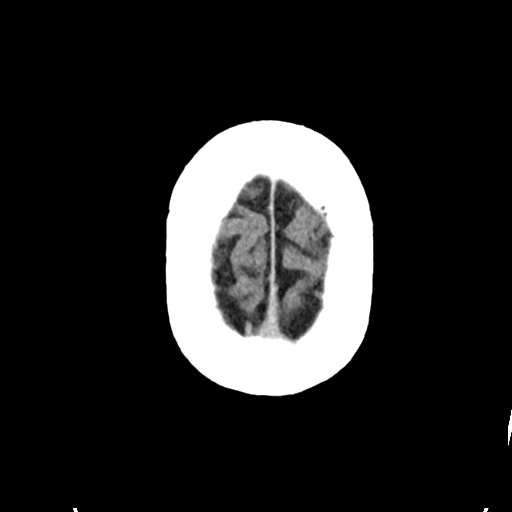

[Series 4: head bone · axial · 0.46mm/px · z∈[-84,-52]mm · 3 of 84 slices shown]
[im 9/84  bone]
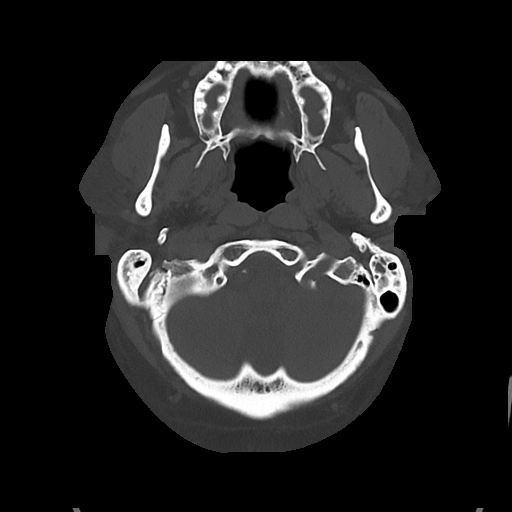
[im 17/84  bone]
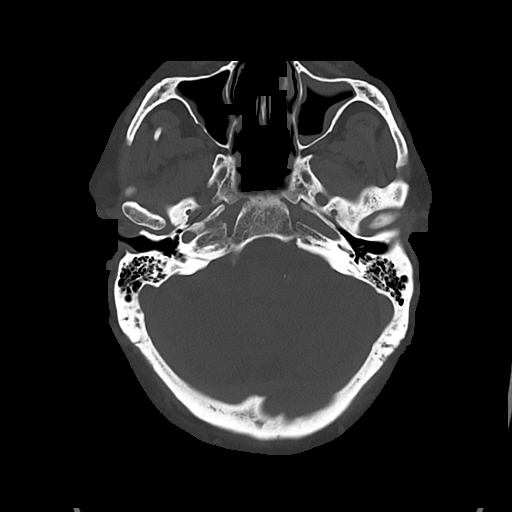
[im 25/84  bone]
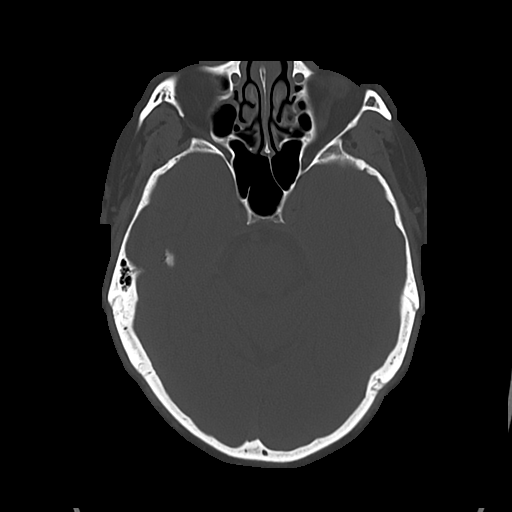

[Series 5: cor soft · coronal · 0.35mm/px · 3 of 74 slices shown]
[im 25/74  brain]
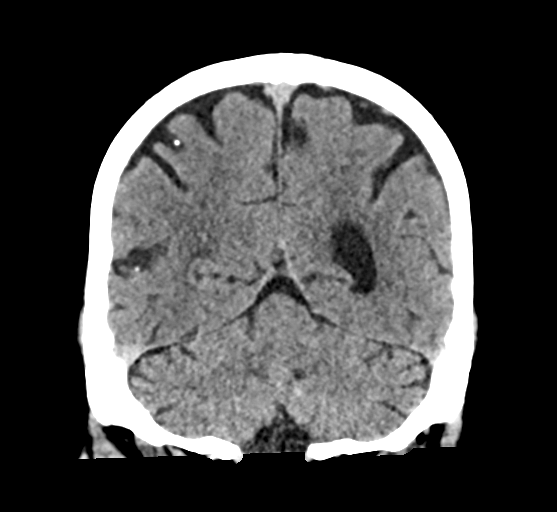
[im 33/74  brain]
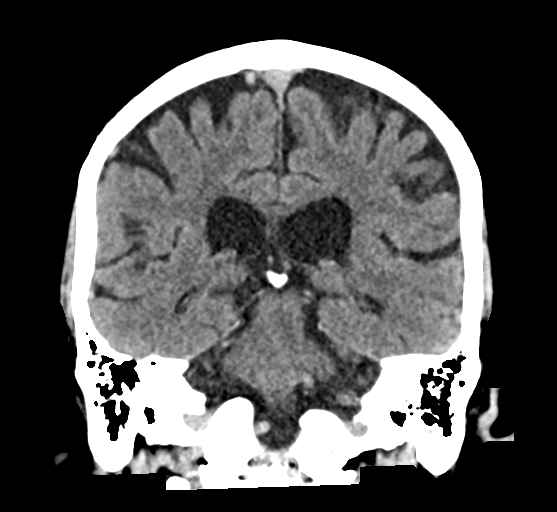
[im 41/74  brain]
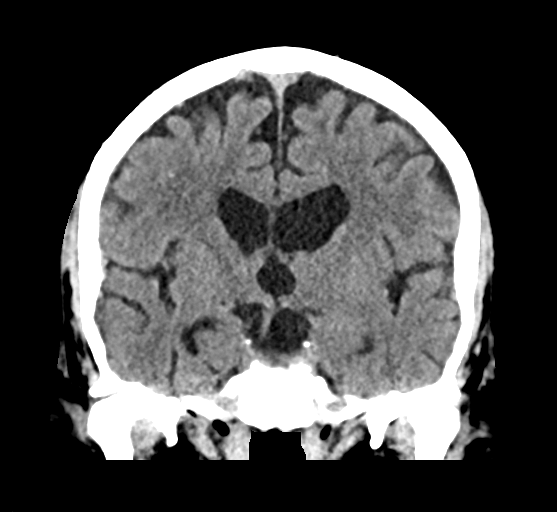

[Series 6: sag soft · sagittal · 0.35mm/px · 3 of 66 slices shown]
[im 22/66  brain]
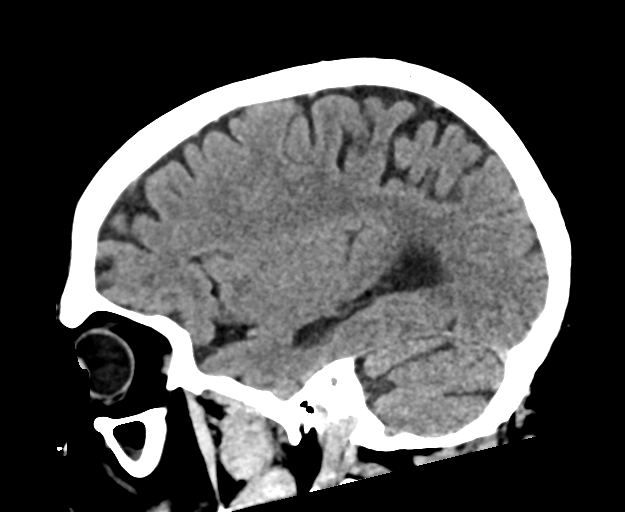
[im 33/66  brain]
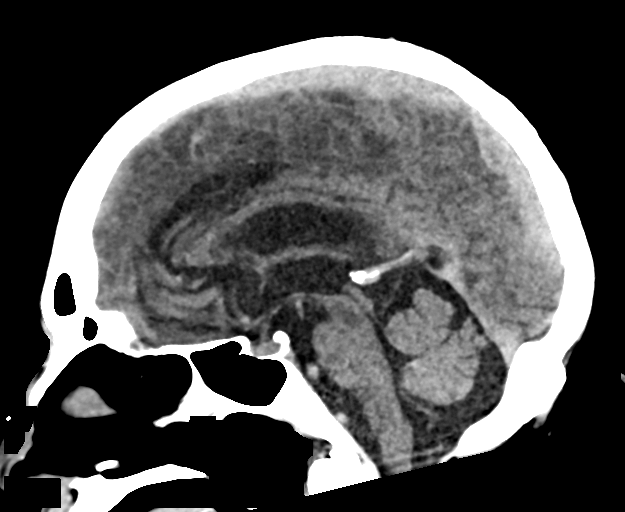
[im 44/66  brain]
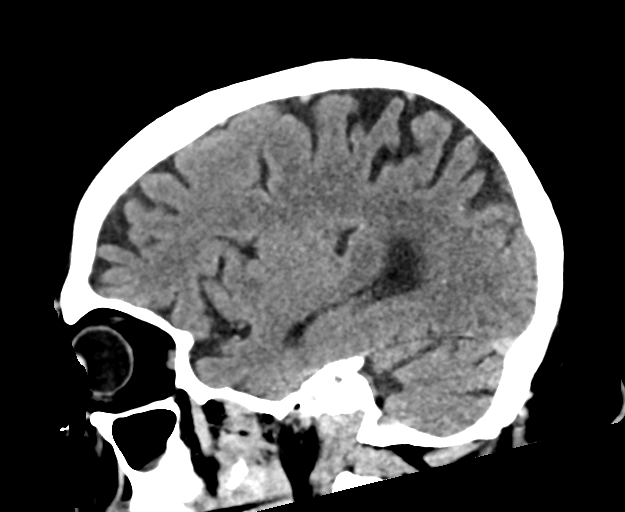

[16 of 47 positions shown; findings below may reference images not displayed]

FINDINGS: Brain: No evidence of acute infarction, hemorrhage, hydrocephalus,
extra-axial collection or mass lesion/mass effect.

Signs of generalized atrophy and evidence of punctate calcification
seen in the RIGHT cerebral hemisphere showing no change. Evidence of
prior infarct in the RIGHT basal ganglia and chronic RIGHT
temporoparietal infarct. Findings are unchanged.

Vascular: No hyperdense vessel or unexpected calcification.

Skull: Normal. Negative for fracture or focal lesion.

Sinuses/Orbits: Mucoperiosteal thickening of bilateral maxillary
sinuses. This is new compared to prior imaging on the LEFT and worse
on the RIGHT. No air-fluid levels in the sinuses. Orbits are
unremarkable.

Other: None.
IMPRESSION: 1. No acute intracranial abnormality.
2. Evidence of prior infarct in the RIGHT basal ganglia and chronic
RIGHT temporoparietal infarct.
3. Mucoperiosteal thickening of bilateral maxillary sinuses. This is
new compared to prior imaging on the LEFT and worse on the RIGHT. No
air-fluid levels. Correlate with any symptoms of sinusitis.

## 2022-08-10 IMAGING — MR MR HEAD W/O CM
12 of 13 series · 44 of 48 positions shown · non-contrast
Comparison: Head CT same day.  MRI 12/12/2020

CLINICAL DATA: Neuro deficit, acute, stroke suspected. Left-sided
facial numbness beginning earlier today.

EXAM:
MRI HEAD WITHOUT CONTRAST
TECHNIQUE: Multiplanar, multiecho pulse sequences of the brain and surrounding
structures were obtained without intravenous contrast.

[Series 5: DWI · axial · 3.0mm · 0.92mm/px · z∈[-126,+24]mm · 8 of 104 slices shown (1 of 4)]
[im 1/104]
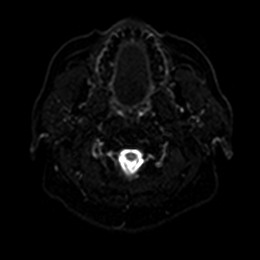
[im 15/104]
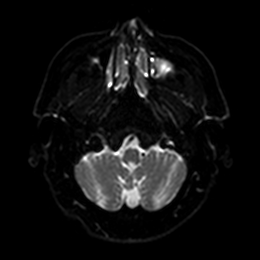
[im 30/104]
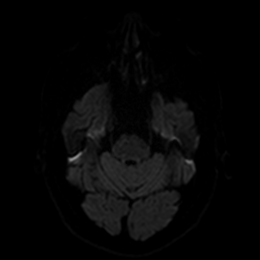
[im 45/104]
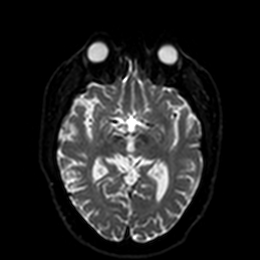
[im 59/104]
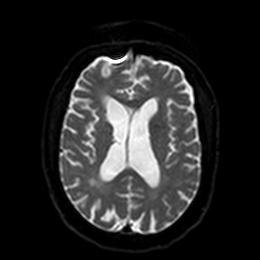
[im 74/104]
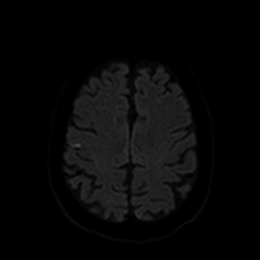
[im 89/104]
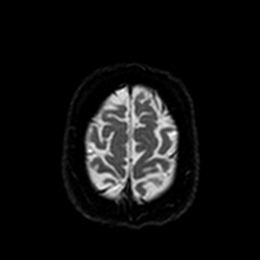
[im 104/104]
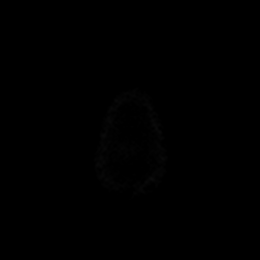

[Series 6: DWI · axial · 3.0mm · 0.92mm/px · z∈[-126,+24]mm · 4 of 52 slices shown (2 of 4)]
[im 1/52]
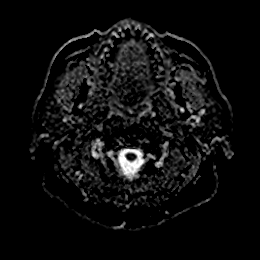
[im 18/52]
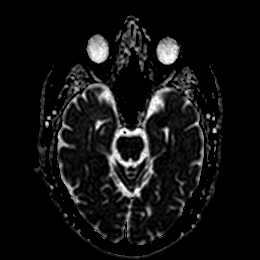
[im 35/52]
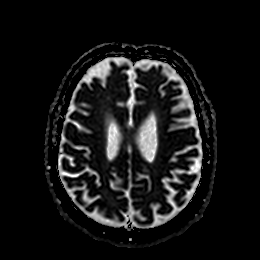
[im 52/52]
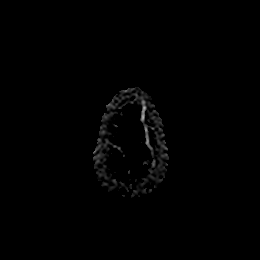

[Series 7: DWI · coronal · 4.0mm · 0.88mm/px · 5 of 76 slices shown (3 of 4)]
[im 1/76]
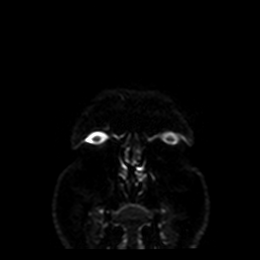
[im 19/76]
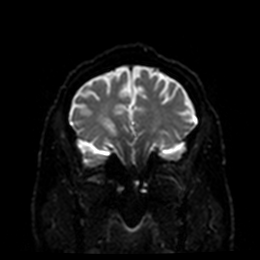
[im 38/76]
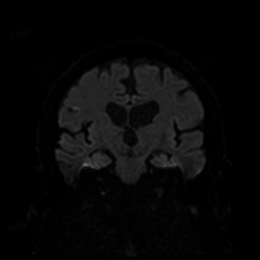
[im 57/76]
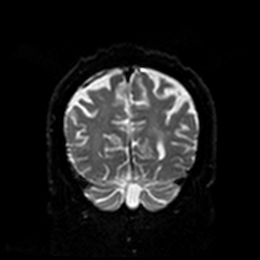
[im 76/76]
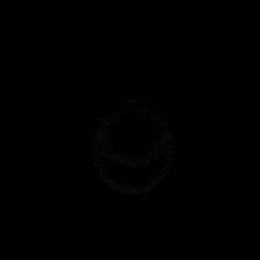

[Series 8: DWI · coronal · 4.0mm · 0.88mm/px · 3 of 38 slices shown (4 of 4)]
[im 1/38]
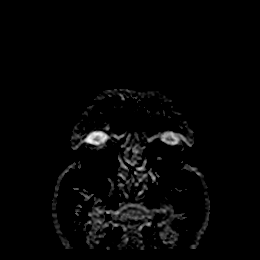
[im 19/38]
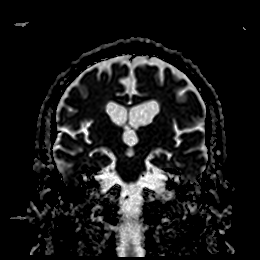
[im 38/38]
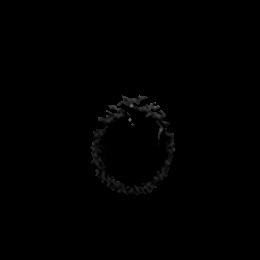

[Series 9: T1 · sagittal · 5.0mm · 0.75mm/px · 2 of 26 slices shown]
[im 1/26]
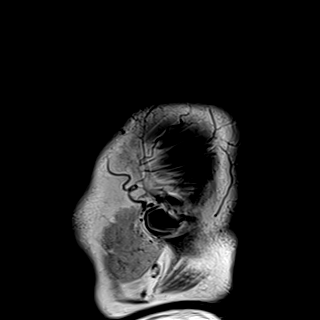
[im 26/26]
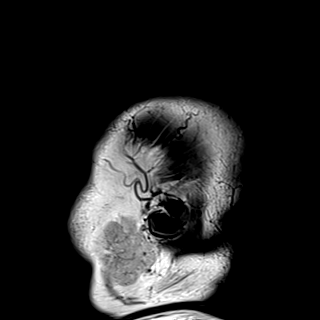

[Series 10: T2 · axial · 5.0mm · 0.75mm/px · z∈[-134,+25]mm · 2 of 28 slices shown (1 of 2)]
[im 1/28]
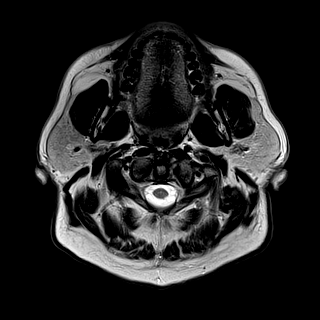
[im 28/28]
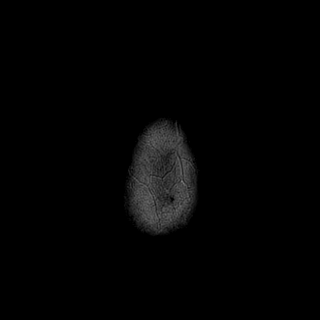

[Series 11: FLAIR · axial · 5.0mm · 0.45mm/px · z∈[-132,+26]mm · 2 of 28 slices shown]
[im 1/28]
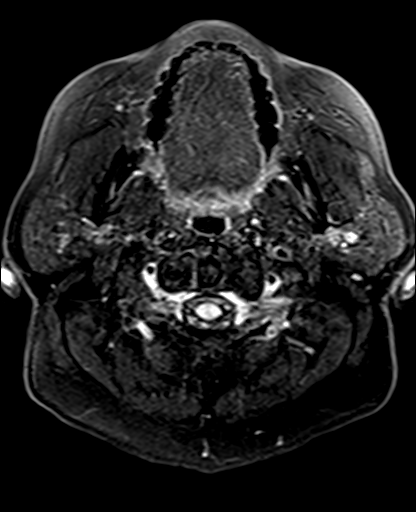
[im 28/28]
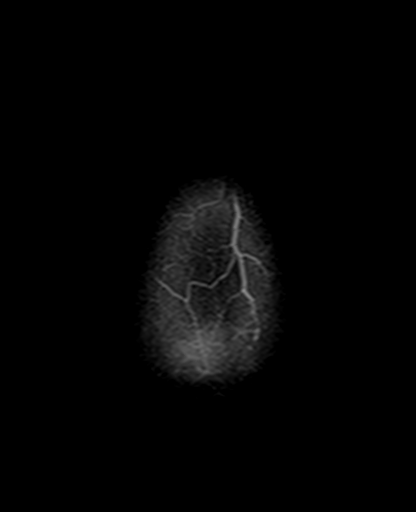

[Series 12: mag_images · axial · 3.0mm · 0.90mm/px · z∈[-140,+34]mm · 4 of 60 slices shown]
[im 1/60]
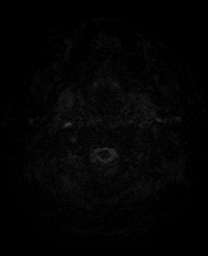
[im 20/60]
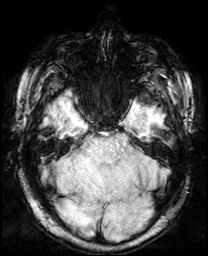
[im 40/60]
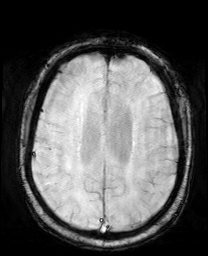
[im 60/60]
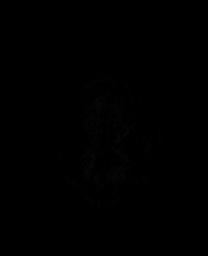

[Series 13: pha_images · axial · 3.0mm · 0.90mm/px · z∈[-140,+34]mm · 4 of 60 slices shown]
[im 1/60]
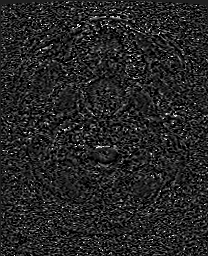
[im 20/60]
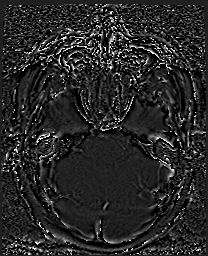
[im 40/60]
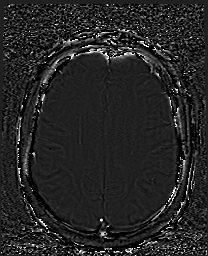
[im 60/60]
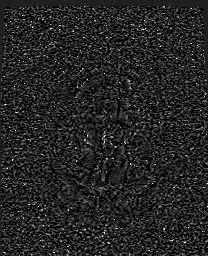

[Series 14: swi_images · axial · 3.0mm · 0.90mm/px · z∈[-140,+34]mm · 4 of 60 slices shown]
[im 1/60]
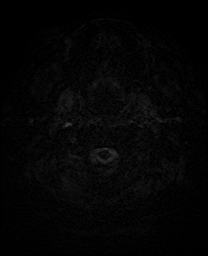
[im 20/60]
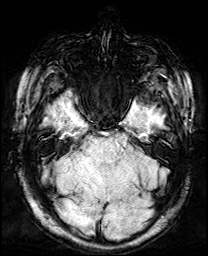
[im 40/60]
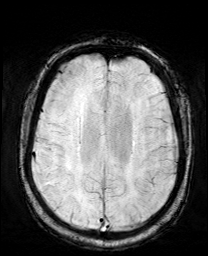
[im 60/60]
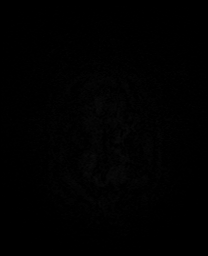

[Series 15: mip_images(sw) · axial · 24.0mm · 0.90mm/px · z∈[-130,+23]mm · 4 of 53 slices shown]
[im 1/53]
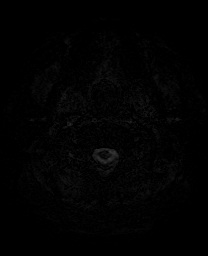
[im 18/53]
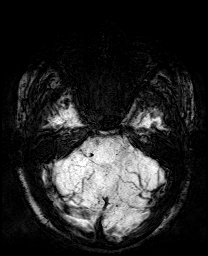
[im 35/53]
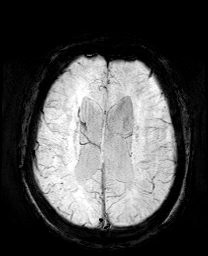
[im 53/53]
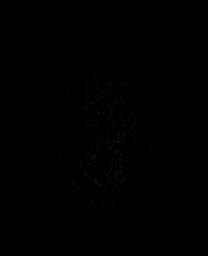

[Series 17: T2 · coronal · 5.0mm · 0.34mm/px · 2 of 32 slices shown (2 of 2)]
[im 1/32]
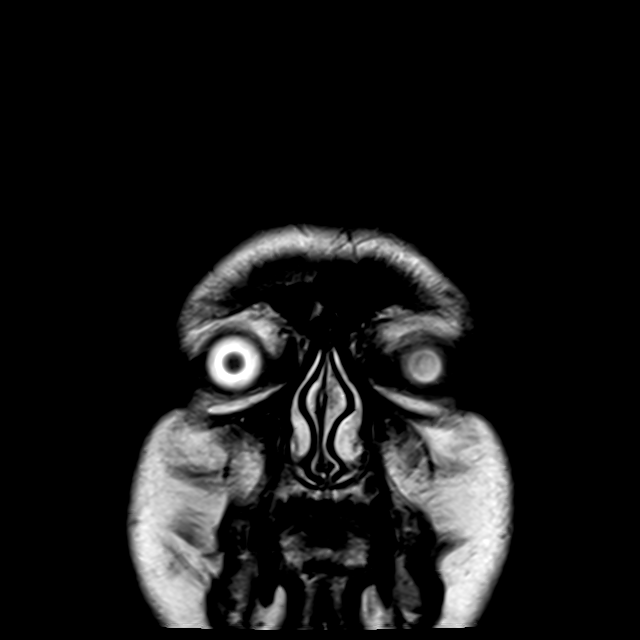
[im 32/32]
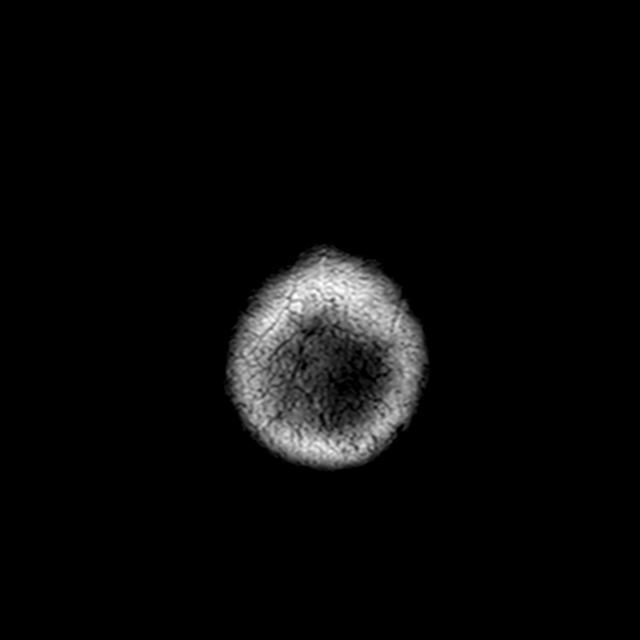

[44 of 48 positions shown; findings below may reference images not displayed]

FINDINGS: Brain: Diffusion imaging shows several punctate foci of acute
infarction in the right posterior frontal 2 frontoparietal junction
region affecting the cortex consistent with micro embolic
infarctions. No large or confluent infarction. No sign of hemorrhage
or mass effect.

The brainstem and cerebellum appear normal. There is old infarction
in the right basal ganglia. There are old small vessel ischemic
changes of a mild degree within the hemispheric white matter. There
are a few old cortical infarctions in the right parietal region,
less than a cm. No mass, hemorrhage, hydrocephalus or extra-axial
collection.

Vascular: Major vessels at the base of the brain show flow.

Skull and upper cervical spine: Negative

Sinuses/Orbits: Clear/normal

Other: None
IMPRESSION: Recurrent small acute embolic infarctions in the right middle
cerebral artery territory seen in the right posterior frontal lobe
and frontoparietal junction region. No large confluent infarction.
No mass effect or hemorrhage. Few older small embolic infarctions in
the right MCA territory were acute in Tuesday December, 2020.

## 2022-08-15 ENCOUNTER — Other Ambulatory Visit: Payer: Self-pay | Admitting: Cardiology

## 2022-08-20 NOTE — Progress Notes (Signed)
Carelink Summary Report / Loop Recorder 

## 2022-08-23 ENCOUNTER — Ambulatory Visit: Payer: Medicare Other | Admitting: Neurology

## 2022-08-23 ENCOUNTER — Encounter: Payer: Self-pay | Admitting: Neurology

## 2022-08-23 NOTE — Progress Notes (Deleted)
[facility] [address] [place]. [place] [address] [phone]       OFFICE FOLLOW UP VISIT NOTE  [name] Date of Birth:  [birth year] Medical Record Number:  [id number]   Primary neurologist: Dr. Pearlean Brownie Sleep neurologist: Dr. Vickey Huger  Reason for visit: Stroke and CPAP follow-up    No chief complaint on file.      HPI: Today, August 22, 2021 SS: Established patient of Kirk Frazier's last seen October 2023.  Update 10/09/2021 JM: Patient returns for stroke and initial CPAP follow-up visit accompanied by his wife.  Previously seen for stroke follow-up 7 months ago with Dr. Pearlean Brownie.  He has been stable from stroke standpoint without new stroke/TIA symptoms.  Loop recorder placed back in April and showed evidence of A-fib in May and has since been started on Eliquis 5 mg twice daily in addition to aspirin, tolerating well.  Closely followed by A-fib clinic.  Also remains on Zetia and atorvastatin.  Blood pressure well controlled.  Routinely follows with PCP. He does mention occasional posterior headaches which seem to improve after eating.   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  Review of compliance report as below showing excellent usage although residual AHI 7.7. Is sleeping better, no longer snores. A little more energy during the day. Does note leaks when laying on side, can wake him up in the middle of the night, will readjust mask with resolution of leaks.   Epworth Sleepiness Scale 14/24 (prior to CPAP 16/24) Fatigue severity scale 23/63 (prior to CPAP 53/63)          History provided for reference purposes only Update 03/09/2021 Dr. Pearlean Brownie: Patient is seen for follow-up after last visit 2 months ago.  He was seen in the emergency room on 01/24/2021 with sudden onset of transient facial numbness.  At the time he arrived to the ER numbness was resolving.  NIH stroke scale was 0.   CT head was unremarkable and MRI scan of the brain showed recurrent small acute embolic infarcts in the right posterior frontal lobe and frontoparietal junction in the MCA territory.  Patient was restarted on dual antiplatelet therapy aspirin and Plavix and was discharged home as there was no need to be admitted for any further work-up and patient had no therapy needs.  Patient states is done well since discharge he has had no other recurrent episodes of numbness or strokes.  He underwent 30-day outpatient cardiac monitor from 01/19/2021 to 02/17/2021 which showed no evidence of atrial fibrillation or significant arrhythmias.  He underwent a TEE on 01/03/2021 which was negative for any cardiac source for embolism, clot or PFO.  Patient remains on aspirin and Plavix and tolerating well with minor bruising but no bleeding.  He has an upcoming appointment with Dr. Elberta Fortis but is not sure if it is for loop recorder or discussion of results of 30-day heart monitor.  He has no other complaints today.  He is filed for long-term disability.  Continues to walk with a cane but this is mostly because he favors his right leg which has right knee replacement done.   Initial visit 01/12/2021 Dr. Pearlean Brownie: 67 year old Caucasian male seen today for initial office consultation visit for stroke.  History is obtained from the patient, review of electronic medical records and I personally reviewed pertinent available imaging films in PACS.  He has past medical history of hypertension, diabetes, hyperlipidemia, coronary artery disease,  congestive heart failure, history of colon and prostate cancer.  Patient states states he developed sudden onset of right eye vision loss in the upper nasal quadrant along with left upper extremity numbness on 12/11/2020.  He presented to [facility] where he was seen by telemetry neurologist and after careful discussion of risk benefits of tPA he chose not to take it and got hospitalized.  MRI scan  showed small tiny right frontal cortical acute as well as subacute infarcts.  His left-sided numbness resolved fairly quickly but right eye upper field partial visual defect has persisted.  2D echo showed normal ejection fraction without cardiac source of embolism.  Study was slightly suboptimal due to poor acoustic windows.  CTA of the head and neck revealed no significant large vessel stenosis or occlusion.  LDL cholesterol is 53.6 mg percent and hemoglobin A1c was elevated at 8.2.  Patient was discharged on aspirin and Plavix which she was given previously taking which he is tolerating well without bleeding or bruising.  He has had no recurrent stroke or TIA symptoms though his partial right eye peripheral vision loss persists.  He denies any prior history of known strokes or TIAs.  He is currently wearing a 30-day heart monitor.  He has upcoming appointment to see Dr. Elberta Fortis electrophysiologist for presumably loop recorder.  He denies known prior history of atrial fibrillation, palpitations, syncope or near syncopal events.     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 05/31/2016   Overview:  1982   Hyperlipidemia 07/07/2014   Nephrolithiasis 02/10/2015   Post-traumatic osteoarthritis of both knees 03/22/2016   Rectal bleeding 02/28/2015   Rib pain on right side 05/31/2016   Overview:  2014: injury   Screening for colon cancer 05/30/2016   Screening for  prostate cancer 05/30/2016   Stroke (HCC) 01/23/2021   Type 2 diabetes mellitus without complication, without long-term current use of insulin (HCC) 02/10/2015   Overview:  2016: 6.5 2017: 412 random with A1C 7.7, started MTF   Wellness examination 05/30/2016    Social History:  Social History   Socioeconomic History   Marital status: Married    Spouse name: Not on file   Number of children: Not on file   Years of education: Not on file   Highest education level: Associate degree: academic program  Occupational History   Not on file  Tobacco Use   Smoking status: Former    Passive exposure: Past   Smokeless tobacco: Never   Tobacco comments:    Former smoker 05/24/21  Vaping Use   Vaping status: Never Used  Substance and Sexual Activity   Alcohol use: No   Drug use: No   Sexual activity: Not on file  Other Topics Concern   Not on file  Social History Narrative   Right handed    Caffeine- 2 cups of coffee per day    Live at  home with wife    Social Determinants of Health   Financial Resource Strain: Not on file  Food Insecurity: Not on file  Transportation Needs: Not on file  Physical Activity: Not on file  Stress: Not on file  Social Connections: Not on file  Intimate Partner Violence: Not on file    Medications:   Current Outpatient Medications on File Prior to Visit  Medication Sig Dispense Refill   allopurinol (ZYLOPRIM) 300 MG tablet Take 300 mg by mouth daily.     apixaban (ELIQUIS) 5 MG TABS tablet TAKE 1 TABLET BY MOUTH TWICE  DAILY 200 tablet 1   aspirin EC 81 MG tablet Take 1 tablet (81 mg total) by mouth daily. Swallow whole. 90 tablet 3   atorvastatin (LIPITOR) 80 MG tablet TAKE 1 TABLET BY MOUTH EVERY DAY 90 tablet 2   Colchicine 0.6 MG CAPS Take 0.6 mg by mouth daily as needed for other (Gout).     ezetimibe (ZETIA) 10 MG tablet Take 10 mg by mouth daily.     FARXIGA 10 MG TABS tablet Take 10 mg by mouth every morning.     fluorouracil (EFUDEX) 5  % cream Apply 1 application topically daily as needed (Skin rash).     Ivermectin 1 % CREA Apply 1 application topically daily as needed (Skin cancers).     metFORMIN (GLUCOPHAGE-XR) 500 MG 24 hr tablet Take 1,000 mg by mouth 2 (two) times daily.      metoprolol tartrate (LOPRESSOR) 25 MG tablet Take 1 tablet (25 mg total) by mouth 2 (two) times daily. 180 tablet 3   nitroGLYCERIN (NITROSTAT) 0.4 MG SL tablet PLACE 1 TABLET UNDER THE TONGUE EVERY FIVE MINUTES AS NEEDED FOR CHEST PAIN. 25 tablet 3   potassium chloride (KLOR-CON) 10 MEQ tablet Take 1 tablet (10 mEq total) by mouth daily. 90 tablet 0   RYBELSUS 7 MG TABS Take 0.5 tablets by mouth daily.     tadalafil (CIALIS) 10 MG tablet Take 10 mg by mouth daily as needed for erectile dysfunction.     tamsulosin (FLOMAX) 0.4 MG CAPS capsule Take 0.4 mg by mouth 2 (two) times daily.     terbinafine (LAMISIL) 250 MG tablet Take 250 mg by mouth daily.     torsemide (DEMADEX) 20 MG tablet TAKE 1 TABLET (20 MG TOTAL) BY MOUTH 2 (TWO) TIMES DAILY. INSUR WILL PAY ON 02/21/19 180 tablet 3   valACYclovir (VALTREX) 500 MG tablet Take 500 mg by mouth See admin instructions. Take 4 tables by mouth ever 12 hours for 1 day     No current facility-administered medications on file prior to visit.    Allergies:   Allergies  Allergen Reactions   Morphine Nausea And Vomiting   Pseudoephedrine Hcl Rash    Physical Exam There were no vitals filed for this visit.  There is no height or weight on file to calculate BMI.  General: Obese very pleasant middle-aged Caucasian male, seated, in no evident distress Head: head normocephalic and atraumatic.   Neck: supple with no carotid or supraclavicular bruits Cardiovascular: regular rate and rhythm, no murmurs Musculoskeletal: no deformity Skin:  no rash/but Vascular:  Normal pulses all extremities  Neurologic Exam Mental Status: Awake and fully alert.  Fluent speech and language.  Oriented to place and time.  Recent and remote memory intact. Attention span, concentration and fund of knowledge appropriate. Mood and affect appropriate.  Cranial Nerves: Pupils equal, briskly reactive to light. Extraocular movements full without nystagmus.  Visual fields show only partial right upper visual field defect to confrontation. Hearing intact. Facial sensation intact. Face, tongue, palate moves normally and symmetrically.  Motor: Normal bulk and tone. Normal strength in all tested extremity muscles.  Sensory.: intact to touch , pinprick , position and vibratory sensation.  Coordination: Rapid alternating movements normal in all extremities. Finger-to-nose and heel-to-shin performed accurately bilaterally. Gait and Station: Arises from chair without difficulty. Stance is normal. Gait demonstrates normal stride length and balance .  Walks with a cane with slight favoring of the right leg \ Reflexes: 1+ and symmetric. Toes downgoing.   ASSESSMENT/PLAN: 67 year old Caucasian male with embolic right frontal MCA branch infarcts and right retinal artery branch occlusion of cryptogenic etiology in December 2022.  Recurrent right MCA branch embolic infarcts in January 2023 as well of cryptogenic etiology.  Vascular risk factors of new dx of A fib now on Eliquis 06/2021 per ILR, hypertension, hyperlipidemia, diabetes, obesity and diagnosed with sleep apnea 06/2021 and initiated CPAP 08/2021.    1.  Recurrent right MCA strokes -s/p ILR 04/2021 with evidence of A-fib 5/22, CHA2DS2-VASc score 6, started on Eliquis 5 mg BID, continue to follow with cardiology -Continue Eliquis, atorvastatin 80 mg daily and Zetia 10 mg daily for secondary stroke prevention managed by PCP.  No indication for continued use of aspirin from stroke standpoint, advised to f/u with cardiology for further discussion  -Continue close PCP follow-up for aggressive stroke risk factor management including BP goal<130/90, HLD with LDL goal<70 and DM with A1c.<7    2.  OSA on CPAP -Compliance report shows satisfactory usage although AHI remains slightly elevated at 7.7.  will slightly increase pressure from 7-15 to 7-16.  Also recommend mask refitting d/t high leak rate to be contributing.  Will reassess download in 1 month to see if any improvement. Discussed continued nightly usage with ensuring greater than 4 hours nightly for optimal benefit and per insurance purposes.  Continue to follow with DME company for any needed supplies or CPAP related concerns  ADDENDUM 12/11/2021: Repeat download over the past 30 days at pressure setting of 8-18 shows residual AHI 7.5 with increased leak rate in the 95th percentile at 68. Will decrease pressure to 7-17 and repeat in 1 month. If leak rate remains elevated, will have patient f/u with DME company for mask refitting as this elevated leak could be due to recent increase in pressure.   ADDENDUM 11/09/2021: Repeat download report after pressure setting changes show continued slightly elevated AHI at 6.8 although improved since prior.  Recommend further slight increase to pressure settings from 7-16 to 8-18. Will repeat download in 1 month.    3. Chronic headaches  -improves after eating  -recommend checking glucose level with headache onset  -recommend eating small frequent meals every 2-3 hours  -if headaches persist or worsen and he would like to try a prophylactic management, he was advised to call office      Otila Kluver, DNP  [facility] [address] [place], [place] [address] [phone]

## 2022-08-27 ENCOUNTER — Telehealth: Payer: Self-pay | Admitting: Cardiology

## 2022-08-27 MED ORDER — METOPROLOL TARTRATE 25 MG PO TABS: 25.0000 mg | ORAL_TABLET | Freq: Two times a day (BID) | ORAL | 0 refills | Status: DC

## 2022-08-27 NOTE — Telephone Encounter (Signed)
*  STAT* If patient is at the pharmacy, call can be transferred to refill team.   1. Which medications need to be refilled? (please list name of each medication and dose if known) Metoprolol   2. Would you like to learn more about the convenience, safety, & potential cost savings by using the [facility]?    3. Are you open to using the [facility] (Type [facility]   4. Which pharmacy/location (including street and city if local pharmacy) is medication to be sent to? [facility]   5. Do they need a 30 day or 90 day supply? 90 # 180 days and refills

## 2022-08-27 NOTE — Telephone Encounter (Signed)
Refill of Metoprolol Tartrate 25 mg sent to [facility].

## 2022-09-05 LAB — CUP PACEART REMOTE DEVICE CHECK
Date Time Interrogation Session: 20240903230230
Implantable Pulse Generator Implant Date: 20230403

## 2022-09-10 ENCOUNTER — Ambulatory Visit (INDEPENDENT_AMBULATORY_CARE_PROVIDER_SITE_OTHER): Payer: Medicare Other

## 2022-09-10 DIAGNOSIS — I639 Cerebral infarction, unspecified: Secondary | ICD-10-CM | POA: Diagnosis not present

## 2022-09-27 DIAGNOSIS — Z9001 Acquired absence of eye: Secondary | ICD-10-CM

## 2022-09-27 HISTORY — DX: Acquired absence of eye: Z90.01

## 2022-09-27 NOTE — Progress Notes (Signed)
Carelink Summary Report / Loop Recorder 

## 2022-10-01 DIAGNOSIS — B009 Herpesviral infection, unspecified: Secondary | ICD-10-CM

## 2022-10-01 HISTORY — DX: Herpesviral infection, unspecified: B00.9

## 2022-10-15 ENCOUNTER — Ambulatory Visit (INDEPENDENT_AMBULATORY_CARE_PROVIDER_SITE_OTHER): Payer: Medicare Other

## 2022-10-15 DIAGNOSIS — I639 Cerebral infarction, unspecified: Secondary | ICD-10-CM | POA: Diagnosis not present

## 2022-10-16 LAB — CUP PACEART REMOTE DEVICE CHECK
Date Time Interrogation Session: 20241013230525
Implantable Pulse Generator Implant Date: 20230403

## 2022-10-28 ENCOUNTER — Other Ambulatory Visit: Payer: Self-pay | Admitting: Cardiology

## 2022-10-30 NOTE — Progress Notes (Signed)
Carelink Summary Report / Loop Recorder 

## 2022-11-01 ENCOUNTER — Ambulatory Visit: Payer: Medicare Other | Admitting: Neurology

## 2022-11-19 ENCOUNTER — Ambulatory Visit: Payer: Medicare Other

## 2022-11-19 DIAGNOSIS — I639 Cerebral infarction, unspecified: Secondary | ICD-10-CM | POA: Diagnosis not present

## 2022-11-19 LAB — CUP PACEART REMOTE DEVICE CHECK
Date Time Interrogation Session: 20241115230551
Implantable Pulse Generator Implant Date: 20230403

## 2022-12-07 ENCOUNTER — Other Ambulatory Visit: Payer: Self-pay | Admitting: Cardiology

## 2022-12-10 ENCOUNTER — Other Ambulatory Visit: Payer: Self-pay | Admitting: Cardiology

## 2022-12-10 NOTE — Telephone Encounter (Signed)
RX sent

## 2022-12-13 NOTE — Progress Notes (Signed)
Carelink Summary Report / Loop Recorder 

## 2022-12-24 ENCOUNTER — Ambulatory Visit: Payer: Medicare Other

## 2022-12-24 DIAGNOSIS — I639 Cerebral infarction, unspecified: Secondary | ICD-10-CM

## 2022-12-24 LAB — CUP PACEART REMOTE DEVICE CHECK
Date Time Interrogation Session: 20241220230522
Implantable Pulse Generator Implant Date: 20230403

## 2023-01-01 ENCOUNTER — Other Ambulatory Visit: Payer: Self-pay | Admitting: Cardiology

## 2023-01-11 ENCOUNTER — Ambulatory Visit (HOSPITAL_COMMUNITY)
Admission: RE | Admit: 2023-01-11 | Discharge: 2023-01-11 | Disposition: A | Discharge status: Home / Self Care | Payer: Medicare Other | Source: Ambulatory Visit | Attending: Physician Assistant | Admitting: Physician Assistant

## 2023-01-11 ENCOUNTER — Encounter (HOSPITAL_COMMUNITY): Payer: Self-pay | Admitting: Physician Assistant

## 2023-01-11 VITALS — BP 132/78 | HR 64 | Ht 69.0 in | Wt 298.4 lb

## 2023-01-11 DIAGNOSIS — I110 Hypertensive heart disease with heart failure: Secondary | ICD-10-CM | POA: Diagnosis not present

## 2023-01-11 DIAGNOSIS — Z7985 Long-term (current) use of injectable non-insulin antidiabetic drugs: Secondary | ICD-10-CM | POA: Diagnosis not present

## 2023-01-11 DIAGNOSIS — D6869 Other thrombophilia: Secondary | ICD-10-CM | POA: Insufficient documentation

## 2023-01-11 DIAGNOSIS — G4733 Obstructive sleep apnea (adult) (pediatric): Secondary | ICD-10-CM | POA: Diagnosis not present

## 2023-01-11 DIAGNOSIS — E669 Obesity, unspecified: Secondary | ICD-10-CM | POA: Insufficient documentation

## 2023-01-11 DIAGNOSIS — I5032 Chronic diastolic (congestive) heart failure: Secondary | ICD-10-CM | POA: Insufficient documentation

## 2023-01-11 DIAGNOSIS — Z6841 Body Mass Index (BMI) 40.0 and over, adult: Secondary | ICD-10-CM | POA: Insufficient documentation

## 2023-01-11 DIAGNOSIS — Z7901 Long term (current) use of anticoagulants: Secondary | ICD-10-CM | POA: Insufficient documentation

## 2023-01-11 DIAGNOSIS — Z79899 Other long term (current) drug therapy: Secondary | ICD-10-CM | POA: Diagnosis not present

## 2023-01-11 DIAGNOSIS — I2510 Atherosclerotic heart disease of native coronary artery without angina pectoris: Secondary | ICD-10-CM | POA: Insufficient documentation

## 2023-01-11 DIAGNOSIS — I480 Paroxysmal atrial fibrillation: Secondary | ICD-10-CM | POA: Diagnosis present

## 2023-01-11 LAB — CBC
HCT: 50.1 % (ref 39.0–52.0)
Hemoglobin: 16.1 g/dL (ref 13.0–17.0)
MCH: 30.3 pg (ref 26.0–34.0)
MCHC: 32.1 g/dL (ref 30.0–36.0)
MCV: 94.4 fL (ref 80.0–100.0)
Platelets: 197 10*3/uL (ref 150–400)
RBC: 5.31 MIL/uL (ref 4.22–5.81)
RDW: 13.4 % (ref 11.5–15.5)
WBC: 5.3 10*3/uL (ref 4.0–10.5)
nRBC: 0 % (ref 0.0–0.2)

## 2023-01-11 LAB — BASIC METABOLIC PANEL
Anion gap: 13 (ref 5–15)
BUN: 20 mg/dL (ref 8–23)
CO2: 22 mmol/L (ref 22–32)
Calcium: 9 mg/dL (ref 8.9–10.3)
Chloride: 106 mmol/L (ref 98–111)
Creatinine, Ser: 1.05 mg/dL (ref 0.61–1.24)
GFR, Estimated: >60 mL/min (ref >60)
Glucose, Bld: 100 mg/dL — ABNORMAL HIGH (ref 70–99)
Potassium: 4.7 mmol/L (ref 3.5–5.1)
Sodium: 141 mmol/L (ref 135–145)

## 2023-01-11 NOTE — Patient Instructions (Signed)
 Beginning in 2025, the prescription drug law requires all Medicare prescription drug plans (Medicare Part D plans)--including both standalone Medicare prescription drug plans and Medicare Advantage plans with prescription drug coverage--to offer Part D enrollees the option to pay out-of-pocket prescription drug costs in the form of monthly payments instead of all at once at the pharmacy. This program, called the Medicare Prescription Payment Plan, will be helpful for people with high cost sharing earlier in the plan year by spreading out those expenses over the course of the plan year.  What is the Medicare Prescription Payment Plan? The Medicare Prescription Payment Plan is a new payment option created under the Inflation Reduction Act that requires Part D plan sponsors to provide their enrollees with the option to pay out-of-pocket prescription drug costs in the form of monthly payments over the course of the plan year instead of all at once to the pharmacy. The program begins on January 02, 2023.  Program participants will pay $0 to the pharmacy for covered Part D drugs, and Part D plan sponsors will then bill program participants monthly for any cost sharing they incur while in the program. Pharmacies will be paid in full by the Part D sponsor in accordance with Part D prompt payment requirements  If there are issues with costs of your medications with new medicare changes ---call your DRUG INSURANCE to discuss payment plan to reduce your monthly costs.

## 2023-01-11 NOTE — Progress Notes (Addendum)
 Primary Care Physician: Ofilia Lamar CROME, MD Primary Cardiologist: Dr Monetta Primary Electrophysiologist: Dr Inocencio Referring Physician: Dr Inocencio Kirk Frazier is a 68 y.o. male with a history of CAD, HTN, HLD, DM, CVA, atrial fibrillation who presents for follow up in the [facility]. He was seen at [facility] 01/23/2021 with recurrent stroke symptoms. He wore a 30-day monitor that showed no evidence of atrial fibrillation. He was seen by Dr Inocencio on 04/03/21 and an ILR was placed for long term monitoring. The device clinic received an alert on 05/22/21 for an afib episode lasting 3 hours and 20 minutes. Patient has a CHADS2VASC score of 6. He was asymptomatic during the episode. He denies alcohol use but does snore. He has an appointment with neurology to discuss sleep study.   On follow up today, patient reports that he has done well since his last visit. His ILR continues to show 0% afib burden. He denies any bleeding issues on anticoagulation.   Today, he denies symptoms of palpitations, chest pain, shortness of breath, orthopnea, PND, lower extremity edema, dizziness, presyncope, syncope, bleeding, or neurologic sequela. The patient is tolerating medications without difficulties and is otherwise without complaint today.    Atrial Fibrillation Risk Factors:  he does have symptoms or diagnosis of sleep apnea. he is is compliant with CPAP therapy.  he does not have a history of rheumatic fever. he does not have a history of alcohol use. The patient does have a history of early familial atrial fibrillation or other arrhythmias. Father had afib.   Atrial Fibrillation Management history:  Previous antiarrhythmic drugs: none Previous cardioversions: none Previous ablations: none Anticoagulation history: Eliquis    Past Medical History:  Diagnosis Date   Benign hypertension 02/10/2015   CAD in native artery 07/07/2014   Overview:  PCI and  DES to LCFx 01/08/14 for Troponin  Normal unstable angina   Chronic diastolic heart failure (HCC) 07/07/2014   Coronary artery disease 02/10/2015   Overview:  Managed CARDS 2016: CIRC 95% PCI Resolute stent LAD 40% EF 65%   Essential hypertension 07/07/2014   Gout 02/10/2015   Overview:  2017: sUA=9.6   Hemoglobinemia due to blood transfusion 05/31/2016   Overview:  1982   Hyperlipidemia 07/07/2014   Nephrolithiasis 02/10/2015   Post-traumatic osteoarthritis of both knees 03/22/2016   Rectal bleeding 02/28/2015   Rib pain on right side 05/31/2016   Overview:  2014: injury   Screening for colon cancer 05/30/2016   Screening for prostate cancer 05/30/2016   Stroke (HCC) 01/23/2021   Type 2 diabetes mellitus without complication, without long-term current use of insulin (HCC) 02/10/2015   Overview:  2016: 6.5 2017: 412 random with A1C 7.7, started MTF   Wellness examination 05/30/2016     Current Outpatient Medications  Medication Sig Dispense Refill   allopurinol (ZYLOPRIM) 300 MG tablet Take 300 mg by mouth daily.     apixaban  (ELIQUIS ) 5 MG TABS tablet TAKE 1 TABLET BY MOUTH TWICE  DAILY 200 tablet 1   aspirin  EC 81 MG tablet Take 1 tablet (81 mg total) by mouth daily. Swallow whole. 90 tablet 3   atorvastatin  (LIPITOR) 80 MG tablet TAKE 1 TABLET BY MOUTH EVERY DAY 90 tablet 2   Colchicine 0.6 MG CAPS Take 0.6 mg by mouth daily as needed for other (Gout).     ezetimibe (ZETIA) 10 MG tablet Take 10 mg by mouth daily.     FARXIGA 10 MG TABS  tablet Take 10 mg by mouth every morning.     fluorouracil (EFUDEX) 5 % cream Apply 1 application topically daily as needed (Skin rash).     Ivermectin 1 % CREA Apply 1 application topically daily as needed (Skin cancers).     metFORMIN (GLUCOPHAGE-XR) 500 MG 24 hr tablet Take 1,000 mg by mouth 2 (two) times daily.      metoprolol  tartrate (LOPRESSOR ) 25 MG tablet Take 1 tablet (25 mg total) by mouth 2 (two) times daily. Must keep appointment on  12/23 for further refills 30 tablet 0   nitroGLYCERIN  (NITROSTAT ) 0.4 MG SL tablet PLACE 1 TABLET UNDER THE TONGUE EVERY FIVE MINUTES AS NEEDED FOR CHEST PAIN. 25 tablet 3   potassium chloride  (KLOR-CON ) 10 MEQ tablet Take 1 tablet (10 mEq total) by mouth daily. Patient needs an appointment for further refills. 2 nd attempt 15 tablet 0   tadalafil (CIALIS) 10 MG tablet Take 10 mg by mouth daily as needed for erectile dysfunction.     tamsulosin (FLOMAX) 0.4 MG CAPS capsule Take 0.4 mg by mouth 2 (two) times daily.     terbinafine (LAMISIL) 250 MG tablet Take 250 mg by mouth daily.     tirzepatide (MOUNJARO) 2.5 MG/0.5ML Pen Inject 2.5 mg into the skin once a week.     torsemide  (DEMADEX ) 20 MG tablet TAKE 1 TABLET (20 MG TOTAL) BY MOUTH 2 (TWO) TIMES DAILY. INSUR WILL PAY ON 02/21/19 180 tablet 3   valACYclovir (VALTREX) 500 MG tablet Take 500 mg by mouth See admin instructions. Take 4 tables by mouth ever 12 hours for 1 day     No current facility-administered medications for this encounter.    ROS- All systems are reviewed and negative except as per the HPI above.  Physical Exam: Vitals:   01/11/23 1053  BP: 132/78  Pulse: 64  Weight: 135.4 kg  Height: 5' 9 (1.753 m)     GEN: Well nourished, well developed in no acute distress NECK: No JVD; No carotid bruits CARDIAC: Regular rate and rhythm, no murmurs, rubs, gallops RESPIRATORY:  Clear to auscultation without rales, wheezing or rhonchi  ABDOMEN: Soft, non-tender, non-distended EXTREMITIES:  No edema; No deformity    Wt Readings from Last 3 Encounters:  01/11/23 135.4 kg  01/11/22 131.1 kg  11/14/21 132.1 kg    EKG today demonstrates  SR, 1st degree AV block Vent. rate 64 BPM PR interval 208 ms QRS duration 98 ms QT/QTcB 414/427 ms   Echo 01/03/21 demonstrated   1. Left ventricular ejection fraction, by estimation, is 60 to 65%. The  left ventricle has normal function.   2. Right ventricular systolic function is  normal. The right ventricular  size is normal.   3. No left atrial/left atrial appendage thrombus was detected.   4. The mitral valve is normal in structure. No evidence of mitral valve regurgitation.   5. The aortic valve is normal in structure. Aortic valve regurgitation is not visualized. No aortic stenosis is present.   6. Agitated saline contrast bubble study was negative, with no evidence of any interatrial shunt.   Epic records are reviewed at length today  CHA2DS2-VASc Score = 7  The patient's score is based upon: CHF History: 1 HTN History: 1 Diabetes History: 1 Stroke History: 2 Vascular Disease History: 1 Age Score: 1 Gender Score: 0         ASSESSMENT AND PLAN: Paroxysmal Atrial Fibrillation (ICD10:  I48.0) The patient's CHA2DS2-VASc score is 7, indicating  a 11.2% annual risk of stroke.   ILR shows 0% afib burden Continue Eliquis  5 mg BID Check bmet/cbc today Continue Lopressor  25 mg BID  Secondary Hypercoagulable State (ICD10:  D68.69) The patient is at significant risk for stroke/thromboembolism based upon his CHA2DS2-VASc Score of 7.  Continue Apixaban  (Eliquis ).   Obesity Body mass index is 44.07 kg/m.  Encouraged lifestyle modification Patient recently started [facility]  CAD No anginal symptoms  HTN Stable on current regimen  Central OSA Encouraged nightly CPAP Followed at [facility]   Follow up in the AF clinic in one year.    Daril Kicks PA-C Afib Clinic [facility] [address] [place], [place] [address] [phone] 01/11/2023 11:22 AM

## 2023-01-25 ENCOUNTER — Other Ambulatory Visit: Payer: Self-pay | Admitting: Cardiology

## 2023-01-28 ENCOUNTER — Ambulatory Visit (INDEPENDENT_AMBULATORY_CARE_PROVIDER_SITE_OTHER): Payer: Medicare Other

## 2023-01-28 DIAGNOSIS — I639 Cerebral infarction, unspecified: Secondary | ICD-10-CM | POA: Diagnosis not present

## 2023-01-28 LAB — CUP PACEART REMOTE DEVICE CHECK
Date Time Interrogation Session: 20250126231609
Implantable Pulse Generator Implant Date: 20230403

## 2023-01-30 ENCOUNTER — Telehealth: Payer: Self-pay | Admitting: Cardiology

## 2023-01-30 ENCOUNTER — Other Ambulatory Visit: Payer: Self-pay

## 2023-01-30 NOTE — Telephone Encounter (Signed)
*  STAT* If patient is at the pharmacy, call can be transferred to refill team.   1. Which medications need to be refilled? (please list name of each medication and dose if known) metoprolol tartrate (LOPRESSOR) 25 MG tablet    2. Would you like to learn more about the convenience, safety, & potential cost savings by using the [facility]?      3. Are you open to using the [facility] (Type [facility]. ).   4. Which pharmacy/location (including street and city if local pharmacy) is medication to be sent to? [facility] - [place], [place] - [address]    5. Do they need a 30 day or 90 day supply? 90   Patient needs a 2 week supply sent to [id number] - [place], [place] - [address] to cover

## 2023-01-31 NOTE — Progress Notes (Signed)
Carelink Summary Report / Loop Recorder

## 2023-02-01 NOTE — Telephone Encounter (Signed)
Pt's medication was already sent to pt's pharmacy requesting pt to make overdue appt with Cardiologist before anymore refills. Confirmation received.

## 2023-02-07 ENCOUNTER — Other Ambulatory Visit (HOSPITAL_COMMUNITY): Payer: Self-pay | Admitting: Physician Assistant

## 2023-02-21 ENCOUNTER — Telehealth: Payer: Self-pay | Admitting: Cardiology

## 2023-02-21 MED ORDER — METOPROLOL TARTRATE 25 MG PO TABS: 25.0000 mg | ORAL_TABLET | Freq: Two times a day (BID) | ORAL | 0 refills | Status: DC

## 2023-02-21 NOTE — Telephone Encounter (Signed)
*  STAT* If patient is at the pharmacy, call can be transferred to refill team.   1. Which medications need to be refilled? (please list name of each medication and dose if known) metoprolol tartrate (LOPRESSOR) 25 MG tablet   2. Which pharmacy/location (including street and city if local pharmacy) is medication to be sent to? -[id number] [place], [place] - [address]  -[facility] - [place], [place] - [address]   3. Do they need a 30 day or 90 day supply?   [facility] + 90 day supply to [facility] if possible. Patient states he is completely out of medication.

## 2023-02-26 ENCOUNTER — Telehealth: Payer: Self-pay | Admitting: Cardiology

## 2023-02-26 NOTE — Telephone Encounter (Signed)
 Patient has to sign form that was dropped off for insurance- ask [place] for more information

## 2023-03-01 ENCOUNTER — Other Ambulatory Visit: Payer: Self-pay | Admitting: Cardiology

## 2023-03-01 NOTE — Telephone Encounter (Signed)
 Prescription sent to pharmacy.

## 2023-03-04 ENCOUNTER — Ambulatory Visit: Payer: Medicare Other | Admitting: Adult Health

## 2023-03-04 ENCOUNTER — Ambulatory Visit: Payer: Medicare Other

## 2023-03-04 ENCOUNTER — Encounter: Payer: Self-pay | Admitting: Adult Health

## 2023-03-04 VITALS — BP 130/73 | HR 61 | Ht 69.0 in | Wt 292.0 lb

## 2023-03-04 DIAGNOSIS — G4739 Other sleep apnea: Secondary | ICD-10-CM | POA: Diagnosis not present

## 2023-03-04 DIAGNOSIS — I63411 Cerebral infarction due to embolism of right middle cerebral artery: Secondary | ICD-10-CM

## 2023-03-04 DIAGNOSIS — I639 Cerebral infarction, unspecified: Secondary | ICD-10-CM | POA: Diagnosis not present

## 2023-03-04 DIAGNOSIS — Z9989 Dependence on other enabling machines and devices: Secondary | ICD-10-CM

## 2023-03-04 LAB — CUP PACEART REMOTE DEVICE CHECK
Date Time Interrogation Session: 20250302230942
Implantable Pulse Generator Implant Date: 20230403

## 2023-03-04 NOTE — Patient Instructions (Addendum)
 Your Plan:  Would highly recommend restarting CPAP, will request a change of mask to reduce leaks and improve tolerance - you will be contacted to get this set up   Continue Eliquis, aspirin, Zetia and atorvastatin for secondary stroke prevention measures   Continue to follow with your PCP and cardiology as scheduled      Follow up in 1 year or call earlier if needed      Thank you for coming to see [place] at [facility]. I hope we have been able to provide you high quality care today.  You may receive a patient satisfaction survey over the next few weeks. We would appreciate your feedback and comments so that we may continue to improve ourselves and the health of our patients.

## 2023-03-04 NOTE — Progress Notes (Signed)
 [facility] [address] [place]. [place] [address] [phone]       OFFICE FOLLOW UP VISIT NOTE  [name] Date of Birth:  June 13, 1955 Medical Record Number:  [id number]   Primary neurologist: Dr. Pearlean Brownie Sleep neurologist: Dr. Vickey Huger  Reason for visit: Stroke and CPAP follow-up   Chief Complaint  Patient presents with   Cerebrovascular Accident    RM 3 with spouse  Pt is well, reports he hasn't used CPAP since June 2025. Has maybe one headache a week. No new stroke concerns.       Follow-up visit:   Prior visit: 10/09/2021   Brief HPI:  Kirk Frazier is a 68 y.o. male with history of right MCA strokes and right retinal artery branch occlusion in 2022 of cryptogenic etiology s/p ILR which showed A fib in 05/2021 and placed on Eliquis and diagnosis of complex sleep apnea in 2023 by Dr. Vickey Huger with initiation of AutoPap.  At prior visit, stable from stroke standpoint, remained on Eliquis, aspirin, Zetia and atorvastatin.  He complained of occasional headaches which improved after eating, discussed eating frequent meals every 2-3 hours and monitoring glucose levels and if headaches persisted or worsened and interested in prophylactic medication, he was advised to call office.  Initial CPAP compliance visit which showed excellent CPAP usage although slightly elevated AHI of 7.7, pressure adjustments made from 7-15 to 7-16 and recommended mask refitting due to high leak, re-eval 11/2021 showed slight improvement of AHI at 6.8 and recommended slight pressure adjustment to 8-18, re-eval 12/2021 showed increased AHI of 7.5 and increased leak rate and advised to follow-up with DME for mask refitting.   Interval history:  Patient returns for stroke and CPAP follow-up visit accompanied by his wife.   He has been stable from stroke standpoint without new stroke/TIA symptoms. S/p right eye removal in 07/2022 through [facility], continues to follow with  [facility] ophthalmology.  Compliant on Eliquis and aspirin without side effects.  Closely followed by A-fib clinic.  Also remains on Zetia and atorvastatin.  Routinely follows with PCP for stroke risk factor management.   He has not used his CPAP since procedure in July, he tried to use shortly after procedure but had air leaking and blowing into his eye which caused irritation, he has also been sleeping in a recliner since the procedure. He now needs to undergo left hip replacement but needs to lose weight prior to his procedure. He plans on restarting his CPAP after his hip procedure as he plans on being back in his bed by then, he prefers to stay in the recliner until then as he is uncomfortable to lay flat.  Previously followed by DME [facility] but has since changed insurance to [facility]. Epworth Sleepiness Scale 16/24. Fatigue severity scale 30/63.      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 05/31/2016   Overview:  1982  Hyperlipidemia 07/07/2014   Nephrolithiasis 02/10/2015   Post-traumatic osteoarthritis of both knees 03/22/2016   Rectal bleeding 02/28/2015   Rib pain on right side 05/31/2016   Overview:  2014: injury   Screening for colon cancer 05/30/2016   Screening for prostate cancer 05/30/2016   Stroke (HCC) 01/23/2021   Type 2 diabetes mellitus without complication, without long-term current use of insulin (HCC) 02/10/2015    Overview:  2016: 6.5 2017: 412 random with A1C 7.7, started MTF   Wellness examination 05/30/2016    Social History:  Social History   Socioeconomic History   Marital status: Married    Spouse name: Not on file   Number of children: Not on file   Years of education: Not on file   Highest education level: Associate degree: academic program  Occupational History   Not on file  Tobacco Use   Smoking status: Former    Passive exposure: Past   Smokeless tobacco: Never   Tobacco comments:    Former smoker 05/24/21  Vaping Use   Vaping status: Never Used  Substance and Sexual Activity   Alcohol use: No   Drug use: No   Sexual activity: Not on file  Other Topics Concern   Not on file  Social History Narrative   Right handed    Caffeine- 2 cups of coffee per day    Live at home with wife    Social Drivers of Corporate investment banker Strain: Not on file  Food Insecurity: Low Risk  (12/27/2022)   Received from [facility]   Hunger Vital Sign    Worried About Running Out of Food in the Last Year: Never true    Ran Out of Food in the Last Year: Never true  Transportation Needs: No Transportation Needs (12/27/2022)   Received from [facility]    In the past 12 months, has lack of reliable transportation kept you from medical appointments, meetings, work or from getting things needed for daily living? : No  Physical Activity: Not on file  Stress: Not on file  Social Connections: Not on file  Intimate Partner Violence: Not on file    Medications:   Current Outpatient Medications on File Prior to Visit  Medication Sig Dispense Refill   allopurinol (ZYLOPRIM) 300 MG tablet Take 300 mg by mouth daily.     apixaban (ELIQUIS) 5 MG TABS tablet TAKE 1 TABLET BY MOUTH TWICE  DAILY 200 tablet 3   aspirin EC 81 MG tablet Take 1 tablet (81 mg total) by mouth daily. Swallow whole. 90 tablet 3   atorvastatin (LIPITOR) 80 MG tablet TAKE 1 TABLET BY MOUTH EVERY DAY  90 tablet 2   Colchicine 0.6 MG CAPS Take 0.6 mg by mouth daily as needed for other (Gout).     ezetimibe (ZETIA) 10 MG tablet Take 10 mg by mouth daily.     FARXIGA 10 MG TABS tablet Take 10 mg by mouth every morning.     fluorouracil (EFUDEX) 5 % cream Apply 1 application topically daily as needed (Skin rash).     Ivermectin 1 % CREA Apply 1 application topically daily as needed (Skin cancers).     metFORMIN (GLUCOPHAGE-XR) 500 MG 24 hr tablet Take 1,000 mg by mouth 2 (two) times daily.      metoprolol tartrate (LOPRESSOR) 25 MG tablet Take 1 tablet (25 mg total) by mouth 2 (two) times daily. Patient needs an appointment for further refills. 3rd and final  attempt. 30 tablet 0   nitroGLYCERIN (NITROSTAT) 0.4 MG SL tablet PLACE 1 TABLET UNDER THE TONGUE EVERY FIVE MINUTES AS NEEDED FOR CHEST PAIN. 25 tablet 3   potassium chloride (KLOR-CON) 10 MEQ tablet Take 1 tablet (10 mEq total) by mouth daily. Patient needs an appointment for further refills. 2 nd attempt 15 tablet 0   tadalafil (CIALIS) 10 MG tablet Take 10 mg by mouth daily as needed for erectile dysfunction.     tamsulosin (FLOMAX) 0.4 MG CAPS capsule Take 0.4 mg by mouth 2 (two) times daily.     terbinafine (LAMISIL) 250 MG tablet Take 250 mg by mouth daily.     tirzepatide [facility]) 2.5 MG/0.5ML Pen Inject 2.5 mg into the skin once a week.     torsemide (DEMADEX) 20 MG tablet TAKE 1 TABLET (20 MG TOTAL) BY MOUTH 2 (TWO) TIMES DAILY. INSUR WILL PAY ON 02/21/19 180 tablet 3   valACYclovir (VALTREX) 500 MG tablet Take 500 mg by mouth See admin instructions. Take 4 tables by mouth ever 12 hours for 1 day     No current facility-administered medications on file prior to visit.    Allergies:   Allergies  Allergen Reactions   Morphine Nausea And Vomiting   Pseudoephedrine Hcl Rash    Physical Exam Today's Vitals   03/04/23 1239  BP: 130/73  Pulse: 61  Weight: 292 lb (132.5 kg)  Height: 5\' 9"  (1.753 m)   Body mass index is 43.12  kg/m.  General: Obese very pleasant middle-aged Caucasian male, seated, in no evident distress  Neurologic Exam Mental Status: Awake and fully alert.  Fluent speech and language.  Oriented to place and time. Recent and remote memory intact. Attention span, concentration and fund of knowledge appropriate. Mood and affect appropriate.  Cranial Nerves: OS pupil briskly reactive to light. S/p removal OD. Extraocular movement full without nystagmus. Visual field OS intact. Hearing intact. Facial sensation intact. Face, tongue, palate moves normally and symmetrically.  Motor: Normal bulk and tone. Normal strength in all tested extremity muscles.  Sensory.: intact to touch , pinprick , position and vibratory sensation.  Coordination: Rapid alternating movements normal in all extremities. Finger-to-nose and heel-to-shin performed accurately bilaterally. Gait and Station: Arises from chair without difficulty. Stance is normal. Gait demonstrates normal stride length and balance with use of cane and favoring of left hip Reflexes: 1+ and symmetric. Toes downgoing.       ASSESSMENT/PLAN: 68 year old Caucasian male with embolic right frontal MCA branch infarcts and right retinal artery branch occlusion of cryptogenic etiology in December 2022.  Recurrent right MCA branch embolic infarcts in January 2023 of cryptogenic etiology.  Vascular risk factors of new dx of A fib now on Eliquis 06/2021 per ILR, hypertension, hyperlipidemia, diabetes, obesity and diagnosed with complex sleep apnea 06/2021 and initiated CPAP 08/2021.    1.  Recurrent right MCA strokes 2. R BRAO  -With residual right eye vision impairment now s/p right eye removal in 07/2022 -continue to follow with ophthalmology as scheduled -Continue Eliquis, atorvastatin 80 mg daily and Zetia 10 mg daily for secondary stroke prevention managed by PCP/cardiology.  No indication for continued use of aspirin from stroke standpoint, advised to f/u with  cardiology to discuss ongoing need from their standpoint -Continue close PCP follow-up for aggressive stroke risk factor management including BP goal<130/90, HLD with LDL goal<70 and DM with A1c.<7   2.  OSA on CPAP -has not used CPAP since 07/2022 -he was encouraged to restart nightly  CPAP use to lower stroke risk as well as aid in weight loss (as this is required prior to being able to undergo left hip replacement) -will request change of interface due to leaks from FFM.  -Continue current pressure settings of 7-17 with EPR 1 -requested patient contact office after about 2 consistent months of using CPAP to obtain download report -will need to transfer DME company from [facility] to [facility] due to insurance      Follow-up in 1 year or call earlier if needed    I spent 30 minutes of face-to-face and non-face-to-face time with patient and wife.  This included previsit chart review, lab review, study review, order entry, electronic health record documentation, patient education and discussion regarding above diagnoses and treatment plan and answered all questions to patient wife satisfaction  Ihor Austin, AGNP-BC  [facility] [address] [place], [place] [address]  Phone [phone] Fax [phone] Note: This document was prepared with digital dictation and possible smart phrase technology. Any transcriptional errors that result from this process are unintentional.

## 2023-03-07 ENCOUNTER — Other Ambulatory Visit: Payer: Self-pay | Admitting: Cardiology

## 2023-03-08 NOTE — Progress Notes (Signed)
 Carelink Summary Report / Loop Recorder

## 2023-03-12 ENCOUNTER — Telehealth: Payer: Self-pay | Admitting: Cardiology

## 2023-03-12 MED ORDER — METOPROLOL TARTRATE 25 MG PO TABS: 25.0000 mg | ORAL_TABLET | Freq: Two times a day (BID) | ORAL | 0 refills | Status: DC

## 2023-03-12 NOTE — Telephone Encounter (Signed)
 Prescription sent to pharmacy.

## 2023-03-12 NOTE — Telephone Encounter (Signed)
*  STAT* If patient is at the pharmacy, call can be transferred to refill team.   1. Which medications need to be refilled? (please list name of each medication and dose if known) metoprolol tartrate (LOPRESSOR) 25 MG tablet   2. Which pharmacy/location (including street and city if local pharmacy) is medication to be sent to? [facility] - [place], [place] - [address]   3. Do they need a 30 day or 90 day supply? 7 days  Patient is out of medication   *STAT* If patient is at the pharmacy, call can be transferred to refill team.   1. Which medications need to be refilled? (please list name of each medication and dose if known) metoprolol tartrate (LOPRESSOR) 25 MG tablet   2. Which pharmacy/location (including street and city if local pharmacy) is medication to be sent to? [facility] - [place], [place] - [address]   3. Do they need a 30 day or 90 day supply? 90

## 2023-03-29 ENCOUNTER — Other Ambulatory Visit: Payer: Self-pay | Admitting: Cardiology

## 2023-04-04 NOTE — Progress Notes (Signed)
 Carelink Summary Report / Loop Recorder

## 2023-04-08 ENCOUNTER — Ambulatory Visit (INDEPENDENT_AMBULATORY_CARE_PROVIDER_SITE_OTHER): Payer: Medicare Other

## 2023-04-08 DIAGNOSIS — I639 Cerebral infarction, unspecified: Secondary | ICD-10-CM

## 2023-04-08 LAB — CUP PACEART REMOTE DEVICE CHECK
Date Time Interrogation Session: 20250406230951
Implantable Pulse Generator Implant Date: 20230403

## 2023-04-11 ENCOUNTER — Ambulatory Visit

## 2023-04-11 ENCOUNTER — Ambulatory Visit: Payer: Medicare Other | Admitting: Cardiology

## 2023-04-11 VITALS — BP 114/72 | HR 70 | Ht 69.0 in | Wt 286.8 lb

## 2023-04-11 DIAGNOSIS — I480 Paroxysmal atrial fibrillation: Secondary | ICD-10-CM | POA: Diagnosis not present

## 2023-04-11 DIAGNOSIS — I2510 Atherosclerotic heart disease of native coronary artery without angina pectoris: Secondary | ICD-10-CM

## 2023-04-11 DIAGNOSIS — I10 Essential (primary) hypertension: Secondary | ICD-10-CM | POA: Diagnosis not present

## 2023-04-11 DIAGNOSIS — I5032 Chronic diastolic (congestive) heart failure: Secondary | ICD-10-CM | POA: Diagnosis not present

## 2023-04-11 MED ORDER — ATORVASTATIN CALCIUM 80 MG PO TABS: 80.0000 mg | ORAL_TABLET | Freq: Every day | ORAL | 2 refills | Status: DC

## 2023-04-11 MED ORDER — POTASSIUM CHLORIDE ER 10 MEQ PO TBCR: 10.0000 meq | EXTENDED_RELEASE_TABLET | Freq: Every day | ORAL | 2 refills | Status: DC

## 2023-04-11 MED ORDER — METOPROLOL TARTRATE 25 MG PO TABS: 25.0000 mg | ORAL_TABLET | Freq: Two times a day (BID) | ORAL | 2 refills | Status: DC

## 2023-04-11 MED ORDER — NITROGLYCERIN 0.4 MG SL SUBL: SUBLINGUAL_TABLET | SUBLINGUAL | 6 refills | Status: AC

## 2023-04-11 NOTE — Patient Instructions (Signed)
 Medication Instructions:  Your physician recommends that you continue on your current medications as directed. Please refer to the Current Medication list given to you today.  *If you need a refill on your cardiac medications before your next appointment, please call your pharmacy*   Lab Work: None Ordered If you have labs (blood work) drawn today and your tests are completely normal, you will receive your results only by: MyChart Message (if you have MyChart) OR A paper copy in the mail If you have any lab test that is abnormal or we need to change your treatment, we will call you to review the results.   Testing/Procedures: None Ordered   Follow-Up: At [facility], you and your health needs are our priority.  As part of our continuing mission to provide you with exceptional heart care, we have created designated Provider Care Teams.  These Care Teams include your primary Cardiologist (physician) and Advanced Practice Providers (APPs -  Physician Assistants and Nurse Practitioners) who all work together to provide you with the care you need, when you need it.  We recommend signing up for the patient portal called "MyChart".  Sign up information is provided on this After Visit Summary.  MyChart is used to connect with patients for Virtual Visits (Telemedicine).  Patients are able to view lab/test results, encounter notes, upcoming appointments, etc.  Non-urgent messages can be sent to your provider as well.   To learn more about what you can do with MyChart, go to [email or web address].    Your next appointment:   6 month follow up

## 2023-04-11 NOTE — Progress Notes (Signed)
 Cardiology Consultation:    Date:  04/11/2023   ID:  Kirk Frazier, DOB Sep 04, 1955, MRN [id number]  PCP:  Kirk Bass, MD  Cardiologist:  Kirk Corporal Ansley Stanwood, MD   Referring MD: Kirk Bass, MD   No chief complaint on file.    ASSESSMENT AND PLAN:   [name] 68 year old male history of CAD s/p PCI of LAD in 2016, hypertension, hyperlipidemia, diabetes mellitus, CVA, atrial fibrillation diabetes mellitus, obstructive sleep apnea, obesity, normal biventricular function on prior TEE from January 2023, recently underwent right eye enucleation, osteoarthritis of knees and hip, relatively sedentary due to musculoskeletal issues.  Problem List Items Addressed This Visit     CAD in native artery - Primary   Lipid functional status due to musculoskeletal issues. Not on aspirin as he continues on Eliquis for A-fib. Continue atorvastatin 80 mg once daily       Relevant Medications   atorvastatin (LIPITOR) 80 MG tablet   metoprolol tartrate (LOPRESSOR) 25 MG tablet   nitroGLYCERIN (NITROSTAT) 0.4 MG SL tablet   Chronic diastolic heart failure (HCC)   Normal biventricular function on last TEE from 2023 January. Euvolemic and compensated. Uses torsemide as needed and has not required in couple months.  He has been losing weight with ongoing treatment with Mounjaro.  Continue with dapagliflozin 10 mg once daily Continue with metoprolol tartrate 25 mg twice daily Continue with torsemide 20 mg to be used as needed       Relevant Medications   atorvastatin (LIPITOR) 80 MG tablet   metoprolol tartrate (LOPRESSOR) 25 MG tablet   nitroGLYCERIN (NITROSTAT) 0.4 MG SL tablet   Essential hypertension   Well-controlled. Continue current treatment metoprolol tartrate 25 mg twice daily.       Relevant Medications   atorvastatin (LIPITOR) 80 MG tablet   metoprolol tartrate (LOPRESSOR) 25 MG tablet   nitroGLYCERIN (NITROSTAT) 0.4 MG SL tablet   Paroxysmal atrial  fibrillation (HCC)   Device identified paroxysmal A-fib on loop recorder May 2023. Remains in sinus rhythm. CHA2DS2-VASc score 6. Remains on anticoagulation with Eliquis 5 mg twice daily.        Relevant Medications   atorvastatin (LIPITOR) 80 MG tablet   metoprolol tartrate (LOPRESSOR) 25 MG tablet   nitroGLYCERIN (NITROSTAT) 0.4 MG SL tablet   Return to clinic tentatively in 6 months.  Earlier follow-up as needed.   History of Present Illness:    Kirk Frazier is a 68 y.o. male who is being seen today for t follow-up visit. PCP is Dough, Doris Cheadle, MD. Last visit with cardiology at our office was with Dr. Dulce Sellar 11-14-2021. Followed up with Dr. Elberta Fortis in EP April 2023. Continues to follow-up with atrial fibrillation clinic and last visit was January 2025  Has history of CAD s/p PCI of LAD in 2016, hypertension, hyperlipidemia, diabetes mellitus, CVA, atrial fibrillation diabetes mellitus, obstructive sleep apnea, obesity, normal biventricular function on prior TEE from January 2023, recently underwent right eye enucleation, osteoarthritis of knees and hip, relatively sedentary due to musculoskeletal issues.  Atrial fibrillation identified on loop recorder monitoring in setting of his CVA diagnosis.  Identified May 2023.  Elevated CHA2DS2-VASc score of 6.  Last echocardiogram available is the TEE from January 2023 that noted LVEF 60 to 65%, no significant valve abnormalities.  No evidence of interatrial shunt.  Last device check of his loop recorder from April 06, 2021 with normal battery function, no new episodes of atrial fibrillation.  Lipid panel from 4 01-2023 with  total cholesterol 103, triglycerides 115, HDL 36, LDL 47.  Here for the visit today accompanied by his wife.  Mentions has been doing well.  Denies any cardiac symptoms symptoms of chest pain, shortness of breath, orthopnea paroxysmal nocturnal dyspnea.  No pedal edema.  No palpitations, lightheadedness,  dizziness or syncopal episodes.  He has been losing weight since the start of Mounjaro in January and has lost 20 pounds. He is in the process of [employer] another 40 pound weight loss in order to be a candidate for undergoing hip and knee arthroplasty tentatively later this year.  With summer coming up he plans to be more active in the swimming pool to help with his mobility.  Otherwise for the most part during the day he is sedentary limiting himself to day-to-day activities.  Uses a cane to ambulate.  Mentions has not had to take any torsemide in the past many months.  Does not drink alcohol does not smoke.  Past Medical History:  Diagnosis Date   Benign hypertension 02/10/2015   Bilateral carotid artery stenosis 06/04/2019   Formatting of this note might be different from the original.  2021: CVA, < 50%     CAD in native artery 07/07/2014   Overview:  PCI and DES to LCFx 01/08/14 for Troponin  Normal unstable angina   Central sleep apnea secondary to congestive heart failure (CHF) (HCC) 07/30/2021   Cerebral infarction due to embolism of right middle cerebral artery (HCC) 05/04/2021   Change in mental status 05/22/2019   Formatting of this note might be different from the original.  2021     Chronic diastolic heart failure (HCC) 07/07/2014   Chronic pulmonary embolism (HCC) 05/04/2021   Class 3 severe obesity with serious comorbidity in adult [facility]) 05/04/2021   Community acquired pneumonia of left upper lobe of lung 12/16/2020   Coronary artery disease 02/10/2015   Overview:  Managed CARDS 2016: CIRC 95% PCI Resolute stent LAD 40% EF 65%   Diarrhea of presumed infectious origin 07/05/2020   Formatting of this note might be different from the original.  07/05/2020 : sulfa     Essential hypertension 07/07/2014   Excessive daytime sleepiness 05/04/2021   Flank pain 02/05/2019   Gastrointestinal symptoms 08/23/2020   Formatting of this note might be different from the original.   08/23/2020:     Gout 02/10/2015   Overview:  2017: sUA=9.6   Hemoglobinemia due to blood transfusion 05/31/2016   Overview:  1982   Herpes simplex disease 10/01/2022   History of enucleation of right eyeball 09/27/2022   09/27/2022: reason for disability     History of stroke 05/28/2019   Formatting of this note might be different from the original.  2021: MRI - right ant basal ganglia, right post temporal, right parietal     Hypercoagulable state due to paroxysmal atrial fibrillation (HCC) 05/24/2021   Hyperlipidemia 07/07/2014   Loud snoring 05/04/2021   Morbid obesity with body mass index (BMI) of 40.0 to 44.9 in adult [facility]) 03/13/2022   Nephrolithiasis 02/10/2015   Onychomycosis 09/19/2020   Paroxysmal atrial fibrillation (HCC) 05/24/2021   Post-traumatic osteoarthritis of both knees 03/22/2016   Rectal bleeding 02/28/2015   Retinal artery branch occlusion of right eye 05/04/2021   Rib pain on right side 05/31/2016   Overview:  2014: injury   Screening for colon cancer 05/30/2016   Screening for prostate cancer 05/30/2016   Sleep apnea 05/04/2021   Stroke (HCC) 01/23/2021   Type 2 diabetes mellitus  without complication, without long-term current use of insulin (HCC) 02/10/2015   Overview:  2016: 6.5 2017: 412 random with A1C 7.7, started MTF   Wellness examination 05/30/2016    Past Surgical History:  Procedure Laterality Date   BUBBLE STUDY  01/03/2021   Procedure: BUBBLE STUDY;  Surgeon: [name], MD;  Location: [facility] ENDOSCOPY;  Service: Cardiovascular;;   CARPAL TUNNEL RELEASE     CORONARY ANGIOPLASTY WITH STENT PLACEMENT     KNEE SURGERY     TEE WITHOUT CARDIOVERSION N/A 01/03/2021   Procedure: TRANSESOPHAGEAL ECHOCARDIOGRAM (TEE);  Surgeon: Elease Hashimoto Deloris Ping, MD;  Location: [facility] ENDOSCOPY;  Service: Cardiovascular;  Laterality: N/A;   TRIGGER FINGER RELEASE      Current Medications: Current Meds  Medication Sig   allopurinol (ZYLOPRIM) 300 MG tablet Take 300 mg by  mouth daily.   apixaban (ELIQUIS) 5 MG TABS tablet TAKE 1 TABLET BY MOUTH TWICE  DAILY   aspirin EC 81 MG tablet Take 1 tablet (81 mg total) by mouth daily. Swallow whole.   Colchicine 0.6 MG CAPS Take 0.6 mg by mouth daily as needed for other (Gout).   ezetimibe (ZETIA) 10 MG tablet Take 10 mg by mouth daily.   FARXIGA 10 MG TABS tablet Take 10 mg by mouth every morning.   fluorouracil (EFUDEX) 5 % cream Apply 1 application topically daily as needed (Skin rash).   Ivermectin 1 % CREA Apply 1 application topically daily as needed (Skin cancers).   metFORMIN (GLUCOPHAGE-XR) 500 MG 24 hr tablet Take 1,000 mg by mouth 2 (two) times daily.    tadalafil (CIALIS) 10 MG tablet Take 10 mg by mouth daily as needed for erectile dysfunction.   tamsulosin (FLOMAX) 0.4 MG CAPS capsule Take 0.4 mg by mouth 2 (two) times daily.   terbinafine (LAMISIL) 250 MG tablet Take 250 mg by mouth daily.   tirzepatide [facility]) 2.5 MG/0.5ML Pen Inject 2.5 mg into the skin once a week.   torsemide (DEMADEX) 20 MG tablet TAKE 1 TABLET (20 MG TOTAL) BY MOUTH 2 (TWO) TIMES DAILY. INSUR WILL PAY ON 02/21/19   valACYclovir (VALTREX) 500 MG tablet Take 500 mg by mouth See admin instructions. Take 4 tables by mouth ever 12 hours for 1 day   [DISCONTINUED] atorvastatin (LIPITOR) 80 MG tablet TAKE 1 TABLET BY MOUTH EVERY DAY   [DISCONTINUED] metoprolol tartrate (LOPRESSOR) 25 MG tablet Take 1 tablet (25 mg total) by mouth 2 (two) times daily. 2nd attempt, patient needs an appt for additional refills   [DISCONTINUED] nitroGLYCERIN (NITROSTAT) 0.4 MG SL tablet PLACE 1 TABLET UNDER THE TONGUE EVERY FIVE MINUTES AS NEEDED FOR CHEST PAIN.   [DISCONTINUED] potassium chloride (KLOR-CON) 10 MEQ tablet Take 1 tablet (10 mEq total) by mouth daily. 2nd attempt, patient needs an appt for additional refills     Allergies:   Morphine and Pseudoephedrine hcl   Social History   Socioeconomic History   Marital status: Married    Spouse name:  Not on file   Number of children: Not on file   Years of education: Not on file   Highest education level: Associate degree: academic program  Occupational History   Not on file  Tobacco Use   Smoking status: Former    Passive exposure: Past   Smokeless tobacco: Never   Tobacco comments:    Former smoker 05/24/21  Vaping Use   Vaping status: Never Used  Substance and Sexual Activity   Alcohol use: No   Drug use:  No   Sexual activity: Not on file  Other Topics Concern   Not on file  Social History Narrative   Right handed    Caffeine- 2 cups of coffee per day    Live at home with wife    Social Drivers of Health   Financial Resource Strain: Not on file  Food Insecurity: Low Risk  (12/27/2022)   Received from [facility]   Hunger Vital Sign    Worried About Running Out of Food in the Last Year: Never true    Ran Out of Food in the Last Year: Never true  Transportation Needs: No Transportation Needs (12/27/2022)   Received from [facility]    In the past 12 months, has lack of reliable transportation kept you from medical appointments, meetings, work or from getting things needed for daily living? : No  Physical Activity: Not on file  Stress: Not on file  Social Connections: Not on file     Family History: The patient's family history includes Cancer in his father; Diabetes in his brother and father; Heart attack in his brother; Heart failure in his father and mother; Hypertension in his mother; Thyroid disease in his sister. ROS:   Please see the history of present illness.    All 14 point review of systems negative except as described per history of present illness.  EKGs/Labs/Other Studies Reviewed:    The following studies were reviewed today:   EKG:       Recent Labs: 01/11/2023: BUN 20; Creatinine, Ser 1.05; Hemoglobin 16.1; Platelets 197; Potassium 4.7; Sodium 141  Recent Lipid Panel    Component Value Date/Time   CHOL 116 06/26/2019  0918   TRIG 114 06/26/2019 0918   HDL 40 06/26/2019 0918   CHOLHDL 2.9 06/26/2019 0918   LDLCALC 55 06/26/2019 0918    Physical Exam:    VS:  BP 114/72   Pulse 70   Ht 5\' 9"  (1.753 m)   Wt 286 lb 12.8 oz (130.1 kg)   SpO2 98%   BMI 42.35 kg/m     Wt Readings from Last 3 Encounters:  04/11/23 286 lb 12.8 oz (130.1 kg)  03/04/23 292 lb (132.5 kg)  01/11/23 298 lb 6.4 oz (135.4 kg)     GENERAL:  Well nourished, well developed in no acute distress.  Right eye enucleation NECK: No JVD; No carotid bruits CARDIAC: RRR, S1 and S2 present, no murmurs, no rubs, no gallops CHEST:  Clear to auscultation without rales, wheezing or rhonchi  Extremities: No pitting pedal edema. Pulses bilaterally symmetric with radial 2+ and dorsalis pedis 2+ NEUROLOGIC:  Alert and oriented x 3  Medication Adjustments/Labs and Tests Ordered: Current medicines are reviewed at length with the patient today.  Concerns regarding medicines are outlined above.  No orders of the defined types were placed in this encounter.  Meds ordered this encounter  Medications   atorvastatin (LIPITOR) 80 MG tablet    Sig: Take 1 tablet (80 mg total) by mouth daily.    Dispense:  90 tablet    Refill:  2   metoprolol tartrate (LOPRESSOR) 25 MG tablet    Sig: Take 1 tablet (25 mg total) by mouth 2 (two) times daily. 2nd attempt, patient needs an appt for additional refills    Dispense:  90 tablet    Refill:  2    Requesting 1 year supply   nitroGLYCERIN (NITROSTAT) 0.4 MG SL tablet    Sig: PLACE 1  TABLET UNDER THE TONGUE EVERY FIVE MINUTES AS NEEDED FOR CHEST PAIN.    Dispense:  25 tablet    Refill:  6   potassium chloride (KLOR-CON) 10 MEQ tablet    Sig: Take 1 tablet (10 mEq total) by mouth daily. 2nd attempt, patient needs an appt for additional refills    Dispense:  90 tablet    Refill:  2    Signed, Demetra Moya reddy Epifania Littrell, MD, MPH, [facility]. 04/11/2023 3:05 PM    [facility]

## 2023-04-11 NOTE — Assessment & Plan Note (Signed)
 Well-controlled. Continue current treatment metoprolol tartrate 25 mg twice daily.

## 2023-04-11 NOTE — Assessment & Plan Note (Signed)
 Normal biventricular function on last TEE from 2023 January. Euvolemic and compensated. Uses torsemide as needed and has not required in couple months.  He has been losing weight with ongoing treatment with Mounjaro.  Continue with dapagliflozin 10 mg once daily Continue with metoprolol tartrate 25 mg twice daily Continue with torsemide 20 mg to be used as needed

## 2023-04-11 NOTE — Assessment & Plan Note (Signed)
 Lipid functional status due to musculoskeletal issues. Not on aspirin as he continues on Eliquis for A-fib. Continue atorvastatin 80 mg once daily

## 2023-04-11 NOTE — Assessment & Plan Note (Signed)
 Device identified paroxysmal A-fib on loop recorder May 2023. Remains in sinus rhythm. CHA2DS2-VASc score 6. Remains on anticoagulation with Eliquis 5 mg twice daily.

## 2023-05-13 ENCOUNTER — Ambulatory Visit (INDEPENDENT_AMBULATORY_CARE_PROVIDER_SITE_OTHER): Payer: Medicare Other

## 2023-05-13 DIAGNOSIS — I639 Cerebral infarction, unspecified: Secondary | ICD-10-CM | POA: Diagnosis not present

## 2023-05-13 LAB — CUP PACEART REMOTE DEVICE CHECK
Date Time Interrogation Session: 20250511234951
Implantable Pulse Generator Implant Date: 20230403

## 2023-05-15 ENCOUNTER — Ambulatory Visit: Payer: Self-pay | Admitting: Cardiology

## 2023-05-22 NOTE — Progress Notes (Signed)
 Carelink Summary Report / Loop Recorder

## 2023-06-13 ENCOUNTER — Ambulatory Visit (INDEPENDENT_AMBULATORY_CARE_PROVIDER_SITE_OTHER)

## 2023-06-13 DIAGNOSIS — I639 Cerebral infarction, unspecified: Secondary | ICD-10-CM | POA: Diagnosis not present

## 2023-06-18 ENCOUNTER — Ambulatory Visit: Payer: Self-pay | Admitting: Cardiology

## 2023-06-26 ENCOUNTER — Other Ambulatory Visit: Payer: Self-pay

## 2023-06-28 NOTE — Telephone Encounter (Signed)
 Rx refill sent to pharmacy.

## 2023-07-01 NOTE — Progress Notes (Signed)
 Carelink Summary Report / Loop Recorder

## 2023-07-15 ENCOUNTER — Ambulatory Visit

## 2023-07-15 DIAGNOSIS — I639 Cerebral infarction, unspecified: Secondary | ICD-10-CM | POA: Diagnosis not present

## 2023-07-15 LAB — CUP PACEART REMOTE DEVICE CHECK
Date Time Interrogation Session: 20250713233806
Implantable Pulse Generator Implant Date: 20230403

## 2023-07-16 ENCOUNTER — Ambulatory Visit: Payer: Self-pay | Admitting: Cardiology

## 2023-08-05 NOTE — Addendum Note (Signed)
 Addended by: VICCI SELLER A on: 08/05/2023 02:10 PM   Modules accepted: Orders

## 2023-08-05 NOTE — Progress Notes (Signed)
 Carelink Summary Report / Loop Recorder

## 2023-08-15 ENCOUNTER — Ambulatory Visit (INDEPENDENT_AMBULATORY_CARE_PROVIDER_SITE_OTHER)

## 2023-08-15 DIAGNOSIS — I639 Cerebral infarction, unspecified: Secondary | ICD-10-CM | POA: Diagnosis not present

## 2023-08-15 LAB — CUP PACEART REMOTE DEVICE CHECK
Date Time Interrogation Session: 20250813232412
Implantable Pulse Generator Implant Date: 20230403

## 2023-08-17 ENCOUNTER — Ambulatory Visit: Payer: Self-pay | Admitting: Cardiology

## 2023-08-27 DIAGNOSIS — M79671 Pain in right foot: Secondary | ICD-10-CM | POA: Insufficient documentation

## 2023-09-16 ENCOUNTER — Ambulatory Visit (INDEPENDENT_AMBULATORY_CARE_PROVIDER_SITE_OTHER)

## 2023-09-16 DIAGNOSIS — I639 Cerebral infarction, unspecified: Secondary | ICD-10-CM

## 2023-09-17 LAB — CUP PACEART REMOTE DEVICE CHECK
Date Time Interrogation Session: 20250914232853
Implantable Pulse Generator Implant Date: 20230403

## 2023-09-23 ENCOUNTER — Ambulatory Visit: Payer: Self-pay | Admitting: Cardiology

## 2023-09-23 NOTE — Progress Notes (Signed)
 Remote Loop Recorder Transmission

## 2023-09-26 NOTE — Progress Notes (Signed)
 Remote Loop Recorder Transmission

## 2023-09-29 ENCOUNTER — Other Ambulatory Visit: Payer: Self-pay

## 2023-10-08 ENCOUNTER — Encounter: Payer: Self-pay | Admitting: *Deleted

## 2023-10-08 ENCOUNTER — Ambulatory Visit

## 2023-10-08 VITALS — BP 130/84 | HR 70 | Ht 69.0 in | Wt 279.0 lb

## 2023-10-08 DIAGNOSIS — E782 Mixed hyperlipidemia: Secondary | ICD-10-CM

## 2023-10-08 DIAGNOSIS — I10 Essential (primary) hypertension: Secondary | ICD-10-CM

## 2023-10-08 DIAGNOSIS — I480 Paroxysmal atrial fibrillation: Secondary | ICD-10-CM

## 2023-10-08 DIAGNOSIS — I5032 Chronic diastolic (congestive) heart failure: Secondary | ICD-10-CM

## 2023-10-08 DIAGNOSIS — I2510 Atherosclerotic heart disease of native coronary artery without angina pectoris: Secondary | ICD-10-CM

## 2023-10-08 MED ORDER — TORSEMIDE 20 MG PO TABS: 20.0000 mg | ORAL_TABLET | ORAL | 3 refills | Status: AC

## 2023-10-08 NOTE — Assessment & Plan Note (Signed)
 Appears mildly hypervolemic. Advised about salt restriction to below 2 g/day. Recommended he take torsemide  20 mg every other day [he self discontinued this several months ago due to concerns about frequency of urination].  Will repeat BMP, BNP and magnesium in 2 weeks after resuming torsemide .  Continue Farxiga 10 mg once daily Continue metoprolol  tartrate 25 mg twice daily

## 2023-10-08 NOTE — Assessment & Plan Note (Signed)
 Last lipid panel 04/02/2023 total cholesterol 103, triglycerides 115, HDL 36, LDL 47.  Given history of CAD, CVA, Target LDL below 55 mg/dL. Continue atorvastatin  80 mg once daily Continue Zetia 10 mg once daily.

## 2023-10-08 NOTE — Assessment & Plan Note (Signed)
 Device identified paroxysmal A-fib on loop recorder May 2023. Remains asymptomatic. CHA2DS2-VASc score 6. On anticoagulation with Eliquis  5 mg twice daily consistently taking it.

## 2023-10-08 NOTE — Assessment & Plan Note (Signed)
 No active symptoms. Overall functional status limited due to musculoskeletal issues. Has continued to take aspirin  81 mg once daily.   Will defer this to neurologist given his multiple episodes of CVA in the past.  Continue atorvastatin  80 mg once daily Continue Zetia 10 mg once daily.

## 2023-10-08 NOTE — Progress Notes (Signed)
 Cardiology Consultation:    Date:  10/08/2023   ID:  Kirk Frazier, DOB [birth year], MRN [id number]  PCP:  Ofilia Lamar CROME, MD  Cardiologist:  Alean SAUNDERS Barrett Holthaus, MD   Referring MD: Ofilia Lamar CROME, MD   Chief Complaint  Patient presents with   Follow-up     ASSESSMENT AND PLAN:   Mr Shepard 68 year old male with history of CAD s/p PCI of LAD in 2016, hypertension, hyperlipidemia, diabetes mellitus, CVA, atrial fibrillation diabetes mellitus, obstructive sleep apnea, obesity, normal biventricular function on prior TEE from January 2023, recently underwent right eye enucleation, osteoarthritis of knees and hip, relatively sedentary due to musculoskeletal issues. Last echocardiogram to review his TEE from January 2023 EF 60 to 65% no significant valve abnormalities, no evidence of interatrial shunt.  Problem List Items Addressed This Visit     CAD in native artery   No active symptoms. Overall functional status limited due to musculoskeletal issues. Has continued to take aspirin  81 mg once daily.   Will defer this to neurologist given his multiple episodes of CVA in the past.  Continue atorvastatin  80 mg once daily Continue Zetia 10 mg once daily.       Relevant Medications   torsemide  (DEMADEX ) 20 MG tablet   Chronic diastolic heart failure (HCC) - Primary   Appears mildly hypervolemic. Advised about salt restriction to below 2 g/day. Recommended he take torsemide  20 mg every other day [he self discontinued this several months ago due to concerns about frequency of urination].  Will repeat BMP, BNP and magnesium in 2 weeks after resuming torsemide .  Continue Farxiga 10 mg once daily Continue metoprolol  tartrate 25 mg twice daily       Relevant Medications   torsemide  (DEMADEX ) 20 MG tablet   Other Relevant Orders   Magnesium   Pro b natriuretic peptide (BNP)   Basic metabolic panel with GFR   Essential hypertension   Well-controlled. Continue current  medications metoprolol  to tartrate 25 mg twice daily.       Relevant Medications   torsemide  (DEMADEX ) 20 MG tablet   Hyperlipidemia   Last lipid panel 04/02/2023 total cholesterol 103, triglycerides 115, HDL 36, LDL 47.  Given history of CAD, CVA, Target LDL below 55 mg/dL. Continue atorvastatin  80 mg once daily Continue Zetia 10 mg once daily.        Relevant Medications   torsemide  (DEMADEX ) 20 MG tablet   Paroxysmal atrial fibrillation (HCC)   Device identified paroxysmal A-fib on loop recorder May 2023. Remains asymptomatic. CHA2DS2-VASc score 6. On anticoagulation with Eliquis  5 mg twice daily consistently taking it.       Relevant Medications   torsemide  (DEMADEX ) 20 MG tablet   Return to clinic tentatively in 6 months.   History of Present Illness:    Kirk Frazier is a 68 y.o. male who is being seen today for follow-up visit. PCP is Dough, Lamar CROME, MD. Last visit with me in the office was 04/11/2023. Also follows up with atrial fibrillation clinic.  Here for the visit today accompanied by his wife.  Wife mentions he is not usually physically active at home and relatively sedentary.  history of CAD s/p PCI of LAD in 2016, hypertension, hyperlipidemia, diabetes mellitus, CVA, atrial fibrillation diabetes mellitus, obstructive sleep apnea, obesity, normal biventricular function on prior TEE from January 2023, recently underwent right eye enucleation, osteoarthritis of knees and hip, relatively sedentary due to musculoskeletal issues. Last echocardiogram to review his TEE from January 2023 EF  60 to 65% no significant valve abnormalities, no evidence of interatrial shunt.  Mentions overall he has been doing well denies any chest pain or shortness of breath.  Functional status he mentions is significantly limited due to left hip pain and right knee pain. Denies any palpitations, lightheadedness, dizziness, syncopal episodes.  Denies any blood in urine or  stools. Mentions mild bilateral ankle edema worse towards the end of the day or when he has his feet down.  Keeps his feet elevated when he is seated and observes the swelling improves.  He has self discontinued torsemide  due to concerns about frequency of urination which he feels has increased since starting Mounjaro.  Does not drink alcohol.  Does not smoke. Past Medical History:  Diagnosis Date   Benign hypertension 02/10/2015   Bilateral carotid artery stenosis 06/04/2019   Formatting of this note might be different from the original.  2021: CVA, < 50%     CAD in native artery 07/07/2014   Overview:  PCI and DES to LCFx 01/08/14 for Troponin  Normal unstable angina   Central sleep apnea secondary to congestive heart failure (CHF) (HCC) 07/30/2021   Cerebral infarction due to embolism of right middle cerebral artery (HCC) 05/04/2021   Change in mental status 05/22/2019   Formatting of this note might be different from the original.  2021     Chronic diastolic heart failure (HCC) 07/07/2014   Chronic pulmonary embolism (HCC) 05/04/2021   Class 3 severe obesity with serious comorbidity in adult [facility]) 05/04/2021   Community acquired pneumonia of left upper lobe of lung 12/16/2020   Coronary artery disease 02/10/2015   Overview:  Managed CARDS 2016: CIRC 95% PCI Resolute stent LAD 40% EF 65%   Diarrhea of presumed infectious origin 07/05/2020   Formatting of this note might be different from the original.  07/05/2020 : sulfa     Essential hypertension 07/07/2014   Excessive daytime sleepiness 05/04/2021   Flank pain 02/05/2019   Gastrointestinal symptoms 08/23/2020   Formatting of this note might be different from the original.  08/23/2020:     Gout 02/10/2015   Overview:  2017: sUA=9.6   Hemoglobinemia due to blood transfusion 05/31/2016   Overview:  1982   Herpes simplex disease 10/01/2022   History of enucleation of right eyeball 09/27/2022   09/27/2022: reason for disability      History of stroke 05/28/2019   Formatting of this note might be different from the original.  2021: MRI - right ant basal ganglia, right post temporal, right parietal     Hypercoagulable state due to paroxysmal atrial fibrillation (HCC) 05/24/2021   Hyperlipidemia 07/07/2014   Loud snoring 05/04/2021   Morbid obesity with body mass index (BMI) of 40.0 to 44.9 in adult [facility]) 03/13/2022   Nephrolithiasis 02/10/2015   Onychomycosis 09/19/2020   Paroxysmal atrial fibrillation (HCC) 05/24/2021   Post-traumatic osteoarthritis of both knees 03/22/2016   Rectal bleeding 02/28/2015   Retinal artery branch occlusion of right eye 05/04/2021   Rib pain on right side 05/31/2016   Overview:  2014: injury   Screening for colon cancer 05/30/2016   Screening for prostate cancer 05/30/2016   Sleep apnea 05/04/2021   Stroke (HCC) 01/23/2021   Type 2 diabetes mellitus without complication, without long-term current use of insulin (HCC) 02/10/2015   Overview:  2016: 6.5 2017: 412 random with A1C 7.7, started MTF   Wellness examination 05/30/2016    Past Surgical History:  Procedure Laterality Date   BUBBLE  STUDY  01/03/2021   Procedure: BUBBLE STUDY;  Surgeon: Alveta Aleene PARAS, MD;  Location: [facility] ENDOSCOPY;  Service: Cardiovascular;;   CARPAL TUNNEL RELEASE     CORONARY ANGIOPLASTY WITH STENT PLACEMENT     KNEE SURGERY     TEE WITHOUT CARDIOVERSION N/A 01/03/2021   Procedure: TRANSESOPHAGEAL ECHOCARDIOGRAM (TEE);  Surgeon: Alveta Aleene PARAS, MD;  Location: [facility] ENDOSCOPY;  Service: Cardiovascular;  Laterality: N/A;   TRIGGER FINGER RELEASE      Current Medications: Current Meds  Medication Sig   allopurinol (ZYLOPRIM) 300 MG tablet Take 300 mg by mouth daily.   apixaban  (ELIQUIS ) 5 MG TABS tablet TAKE 1 TABLET BY MOUTH TWICE  DAILY   aspirin  EC 81 MG tablet Take 1 tablet (81 mg total) by mouth daily. Swallow whole.   atorvastatin  (LIPITOR) 80 MG tablet Take 1 tablet (80 mg total) by mouth daily.    Colchicine 0.6 MG CAPS Take 0.6 mg by mouth daily as needed for other (Gout).   ezetimibe (ZETIA) 10 MG tablet Take 10 mg by mouth daily.   FARXIGA 10 MG TABS tablet Take 10 mg by mouth every morning.   fluorouracil (EFUDEX) 5 % cream Apply 1 application topically daily as needed (Skin rash).   Ivermectin 1 % CREA Apply 1 application topically daily as needed (Skin cancers).   metFORMIN (GLUCOPHAGE-XR) 500 MG 24 hr tablet Take 1,000 mg by mouth 2 (two) times daily.    metoprolol  tartrate (LOPRESSOR ) 25 MG tablet TAKE 1 TABLET BY MOUTH TWICE  DAILY   MOUNJARO 15 MG/0.5ML Pen Inject 15 mg into the skin once a week.   nitroGLYCERIN  (NITROSTAT ) 0.4 MG SL tablet PLACE 1 TABLET UNDER THE TONGUE EVERY FIVE MINUTES AS NEEDED FOR CHEST PAIN.   potassium chloride  (KLOR-CON ) 10 MEQ tablet TAKE 1 TABLET BY MOUTH DAILY   tadalafil (CIALIS) 10 MG tablet Take 10 mg by mouth daily as needed for erectile dysfunction.   tamsulosin (FLOMAX) 0.4 MG CAPS capsule Take 0.4 mg by mouth 2 (two) times daily.   terbinafine (LAMISIL) 250 MG tablet Take 250 mg by mouth daily.   valACYclovir (VALTREX) 500 MG tablet Take 500 mg by mouth See admin instructions. Take 4 tables by mouth ever 12 hours for 1 day   [DISCONTINUED] torsemide  (DEMADEX ) 20 MG tablet TAKE 1 TABLET (20 MG TOTAL) BY MOUTH 2 (TWO) TIMES DAILY. INSUR WILL PAY ON 02/21/19     Allergies:   Morphine and Pseudoephedrine hcl   Social History   Socioeconomic History   Marital status: Married    Spouse name: Not on file   Number of children: Not on file   Years of education: Not on file   Highest education level: Associate degree: academic program  Occupational History   Not on file  Tobacco Use   Smoking status: Former    Passive exposure: Past   Smokeless tobacco: Never   Tobacco comments:    Former smoker 05/24/21  Vaping Use   Vaping status: Never Used  Substance and Sexual Activity   Alcohol use: No   Drug use: No   Sexual activity: Not on  file  Other Topics Concern   Not on file  Social History Narrative   Right handed    Caffeine- 2 cups of coffee per day    Live at home with wife    Social Drivers of Corporate investment banker Strain: Not on file  Food Insecurity: Low Risk  (08/27/2023)   Received from  [facility]   Hunger Vital Sign    Within the past 12 months, you worried that your food would run out before you got money to buy more: Never true    Within the past 12 months, the food you bought just didn't last and you didn't have money to get more. : Never true  Transportation Needs: No Transportation Needs (08/27/2023)   Received from [facility]    In the past 12 months, has lack of reliable transportation kept you from medical appointments, meetings, work or from getting things needed for daily living? : No  Physical Activity: Not on file  Stress: Not on file  Social Connections: Not on file     Family History: The patient's family history includes Cancer in his father; Diabetes in his brother and father; Heart attack in his brother; Heart failure in his father and mother; Hypertension in his mother; Thyroid disease in his sister. ROS:   Please see the history of present illness.    All 14 point review of systems negative except as described per history of present illness.  EKGs/Labs/Other Studies Reviewed:    The following studies were reviewed today:   EKG:       Recent Labs: 01/11/2023: BUN 20; Creatinine, Ser 1.05; Hemoglobin 16.1; Platelets 197; Potassium 4.7; Sodium 141  Recent Lipid Panel    Component Value Date/Time   CHOL 116 06/26/2019 0918   TRIG 114 06/26/2019 0918   HDL 40 06/26/2019 0918   CHOLHDL 2.9 06/26/2019 0918   LDLCALC 55 06/26/2019 0918    Physical Exam:    VS:  BP 130/84   Pulse 70   Ht 5' 9 (1.753 m)   Wt 279 lb (126.6 kg)   SpO2 95%   BMI 41.20 kg/m     Wt Readings from Last 3 Encounters:  10/08/23 279 lb (126.6 kg)  04/11/23 286 lb  12.8 oz (130.1 kg)  03/04/23 292 lb (132.5 kg)     GENERAL:  Well nourished, well developed in no acute distress.  Right eye enucleation. NECK: No JVD; No carotid bruits CARDIAC: RRR, S1 and S2 present, no murmurs, no rubs, no gallops CHEST:  Clear to auscultation without rales, wheezing or rhonchi  Extremities: 1+ bilateral pitting pedal edema extending slightly above the ankles. Pulses bilaterally symmetric with radial 2+ and dorsalis pedis 2+ NEUROLOGIC:  Alert and oriented x 3  Medication Adjustments/Labs and Tests Ordered: Current medicines are reviewed at length with the patient today.  Concerns regarding medicines are outlined above.  Orders Placed This Encounter  Procedures   Magnesium   Pro b natriuretic peptide (BNP)   Basic metabolic panel with GFR   Meds ordered this encounter  Medications   torsemide  (DEMADEX ) 20 MG tablet    Sig: Take 1 tablet (20 mg total) by mouth every other day.    Dispense:  45 tablet    Refill:  3    Signed, Cabell Lazenby reddy Yuliana Vandrunen, MD, MPH, [facility]. 10/08/2023 2:05 PM    [facility]

## 2023-10-08 NOTE — Assessment & Plan Note (Signed)
 Well-controlled. Continue current medications metoprolol  to tartrate 25 mg twice daily.

## 2023-10-08 NOTE — Patient Instructions (Addendum)
 Medication Instructions:   START: Torsemide  20mg  1 tablet every other day   Lab Work: Your physician recommends that you return for lab work in: 2 weeks You need to have labs done when you are fasting.  You can come Monday through Friday 8:30 am to 12:00 pm and 1:15 to 4:30. You do not need to make an appointment as the order has already been placed. The labs you are going to have done are BMP, ProBNP, MGM   Testing/Procedures: None Ordered   Follow-Up: At [facility], you and your health needs are our priority.  As part of our continuing mission to provide you with exceptional heart care, we have created designated Provider Care Teams.  These Care Teams include your primary Cardiologist (physician) and Advanced Practice Providers (APPs -  Physician Assistants and Nurse Practitioners) who all work together to provide you with the care you need, when you need it.  We recommend signing up for the patient portal called MyChart.  Sign up information is provided on this After Visit Summary.  MyChart is used to connect with patients for Virtual Visits (Telemedicine).  Patients are able to view lab/test results, encounter notes, upcoming appointments, etc.  Non-urgent messages can be sent to your provider as well.   To learn more about what you can do with MyChart, go to [email or web address].    Your next appointment:   6 month(s)  The format for your next appointment:   In Person  Provider:   Alean Kobus, MD   Other Instructions NA

## 2023-10-09 NOTE — Progress Notes (Signed)
 Remote Loop Recorder Transmission

## 2023-10-16 ENCOUNTER — Other Ambulatory Visit: Payer: Self-pay

## 2023-10-17 ENCOUNTER — Ambulatory Visit

## 2023-10-17 ENCOUNTER — Encounter

## 2023-10-17 DIAGNOSIS — I639 Cerebral infarction, unspecified: Secondary | ICD-10-CM

## 2023-10-17 LAB — CUP PACEART REMOTE DEVICE CHECK
Date Time Interrogation Session: 20251015232207
Implantable Pulse Generator Implant Date: 20230403

## 2023-10-17 NOTE — Progress Notes (Signed)
 Remote Loop Recorder Transmission

## 2023-10-23 LAB — BASIC METABOLIC PANEL WITH GFR
BUN/Creatinine Ratio: 24 (ref 10–24)
BUN: 23 mg/dL (ref 8–27)
CO2: 24 mmol/L (ref 20–29)
Calcium: 9.7 mg/dL (ref 8.6–10.2)
Chloride: 101 mmol/L (ref 96–106)
Creatinine, Ser: 0.94 mg/dL (ref 0.76–1.27)
Glucose: 161 mg/dL — ABNORMAL HIGH (ref 70–99)
Potassium: 4.7 mmol/L (ref 3.5–5.2)
Sodium: 142 mmol/L (ref 134–144)
eGFR: 89 mL/min/1.73 (ref >59)

## 2023-10-23 LAB — MAGNESIUM: Magnesium: 2.1 mg/dL (ref 1.6–2.3)

## 2023-10-23 LAB — PRO B NATRIURETIC PEPTIDE: NT-Pro BNP: <36 pg/mL (ref 0–376)

## 2023-10-28 ENCOUNTER — Ambulatory Visit: Payer: Self-pay | Admitting: Cardiology

## 2023-11-17 ENCOUNTER — Encounter

## 2023-11-18 ENCOUNTER — Encounter

## 2023-11-19 ENCOUNTER — Ambulatory Visit: Attending: Cardiology

## 2023-11-19 DIAGNOSIS — I639 Cerebral infarction, unspecified: Secondary | ICD-10-CM

## 2023-11-20 ENCOUNTER — Ambulatory Visit: Payer: Self-pay | Admitting: Cardiology

## 2023-11-20 LAB — CUP PACEART REMOTE DEVICE CHECK
Date Time Interrogation Session: 20251117232638
Implantable Pulse Generator Implant Date: 20230403

## 2023-11-22 NOTE — Progress Notes (Signed)
 Remote Loop Recorder Transmission

## 2023-12-18 ENCOUNTER — Encounter

## 2023-12-20 ENCOUNTER — Ambulatory Visit

## 2023-12-20 DIAGNOSIS — I639 Cerebral infarction, unspecified: Secondary | ICD-10-CM | POA: Diagnosis not present

## 2023-12-20 LAB — CUP PACEART REMOTE DEVICE CHECK
Date Time Interrogation Session: 20251218231855
Implantable Pulse Generator Implant Date: 20230403

## 2023-12-23 NOTE — Progress Notes (Signed)
 Remote Loop Recorder Transmission

## 2023-12-27 ENCOUNTER — Ambulatory Visit: Payer: Self-pay | Admitting: Cardiology

## 2024-01-13 ENCOUNTER — Ambulatory Visit (HOSPITAL_COMMUNITY)
Admission: RE | Admit: 2024-01-13 | Discharge: 2024-01-13 | Disposition: A | Payer: Medicare (Managed Care) | Source: Ambulatory Visit | Attending: Physician Assistant | Admitting: Physician Assistant

## 2024-01-13 VITALS — BP 136/82 | HR 73 | Ht 69.0 in | Wt 272.8 lb

## 2024-01-13 DIAGNOSIS — I4891 Unspecified atrial fibrillation: Secondary | ICD-10-CM

## 2024-01-13 DIAGNOSIS — I480 Paroxysmal atrial fibrillation: Secondary | ICD-10-CM

## 2024-01-13 DIAGNOSIS — D6869 Other thrombophilia: Secondary | ICD-10-CM

## 2024-01-13 NOTE — Progress Notes (Signed)
 "   Primary Care Physician: Ofilia Lamar CROME, MD Primary Cardiologist: Dr Monetta Primary Electrophysiologist: Dr Inocencio Referring Physician: Dr Camnitz   Kirk Frazier is a 69 y.o. male with a history of CAD, HTN, HLD, DM, CVA, atrial fibrillation who presents for follow up in the [facility]. He was seen at [facility] 01/23/2021 with recurrent stroke symptoms. He wore a 30-day monitor that showed no evidence of atrial fibrillation. He was seen by Dr Inocencio on 04/03/21 and an ILR was placed for long term monitoring. The device clinic received an alert on 05/22/21 for an afib episode lasting 3 hours and 20 minutes. He was asymptomatic during the episode. He was started on Eliquis  for stroke prevention.   Patient returns for follow up for atrial fibrillation. He remains in SR today and feels well. His ILR shows no interim afib episodes. No bleeding issues on anticoagulation.   Today, he  denies symptoms of palpitations, chest pain, shortness of breath, orthopnea, PND, lower extremity edema, dizziness, presyncope, syncope, bleeding, or neurologic sequela. The patient is tolerating medications without difficulties and is otherwise without complaint today.    Atrial Fibrillation Risk Factors:  he does have symptoms or diagnosis of sleep apnea. he does not have a history of rheumatic fever. he does not have a history of alcohol use. The patient does have a history of early familial atrial fibrillation or other arrhythmias. Father had afib.   Atrial Fibrillation Management history:  Previous antiarrhythmic drugs: none Previous cardioversions: none Previous ablations: none Anticoagulation history: Eliquis    Past Medical History:  Diagnosis Date   Benign hypertension 02/10/2015   Bilateral carotid artery stenosis 06/04/2019   Formatting of this note might be different from the original.  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  2021     Chronic diastolic heart failure (HCC) 07/07/2014   Chronic pulmonary embolism (HCC) 05/04/2021   Class 3 severe obesity with serious comorbidity in adult [facility]) 05/04/2021   Community acquired pneumonia of left upper lobe of lung 12/16/2020   Coronary artery disease 02/10/2015   Overview:  Managed CARDS 2016: CIRC 95% PCI Resolute stent LAD 40% EF 65%   Diarrhea of presumed infectious origin 07/05/2020   Formatting of this note might be different from the original.  07/05/2020 : sulfa     Essential hypertension 07/07/2014   Excessive daytime sleepiness 05/04/2021   Flank pain 02/05/2019   Gastrointestinal symptoms 08/23/2020   Formatting of this note might be different from the original.  08/23/2020:     Gout 02/10/2015   Overview:  2017: sUA=9.6   Hemoglobinemia due to blood transfusion 05/31/2016   Overview:  1982   Herpes simplex disease 10/01/2022   History of enucleation of right eyeball 09/27/2022   09/27/2022: reason for disability     History of stroke 05/28/2019   Formatting of this note might be different from the original.  2021: MRI - right ant basal ganglia, right post temporal, right parietal     Hypercoagulable state due to paroxysmal atrial fibrillation (HCC) 05/24/2021   Hyperlipidemia 07/07/2014  Loud snoring 05/04/2021   Morbid obesity with body mass index (BMI) of 40.0 to 44.9 in adult [facility]) 03/13/2022   Nephrolithiasis 02/10/2015   Onychomycosis 09/19/2020   Paroxysmal atrial fibrillation (HCC) 05/24/2021   Post-traumatic osteoarthritis of both knees 03/22/2016   Rectal bleeding 02/28/2015   Retinal artery branch occlusion  of right eye 05/04/2021   Rib pain on right side 05/31/2016   Overview:  2014: injury   Screening for colon cancer 05/30/2016   Screening for prostate cancer 05/30/2016   Sleep apnea 05/04/2021   Stroke (HCC) 01/23/2021   Type 2 diabetes mellitus without complication, without long-term current use of insulin (HCC) 02/10/2015   Overview:  2016: 6.5 2017: 412 random with A1C 7.7, started MTF   Wellness examination 05/30/2016     Current Outpatient Medications  Medication Sig Dispense Refill   allopurinol (ZYLOPRIM) 300 MG tablet Take 300 mg by mouth daily.     apixaban  (ELIQUIS ) 5 MG TABS tablet TAKE 1 TABLET BY MOUTH TWICE  DAILY 200 tablet 3   aspirin  EC 81 MG tablet Take 1 tablet (81 mg total) by mouth daily. Swallow whole. 90 tablet 3   atorvastatin  (LIPITOR) 80 MG tablet Take 1 tablet (80 mg total) by mouth daily. 90 tablet 3   Colchicine 0.6 MG CAPS Take 0.6 mg by mouth daily as needed for other (Gout).     ezetimibe (ZETIA) 10 MG tablet Take 10 mg by mouth daily.     FARXIGA 10 MG TABS tablet Take 10 mg by mouth every morning.     fluorouracil (EFUDEX) 5 % cream Apply 1 application topically daily as needed (Skin rash).     Ivermectin 1 % CREA Apply 1 application topically daily as needed (Skin cancers).     metFORMIN (GLUCOPHAGE-XR) 500 MG 24 hr tablet Take 1,000 mg by mouth 2 (two) times daily.      metoprolol  tartrate (LOPRESSOR ) 25 MG tablet TAKE 1 TABLET BY MOUTH TWICE  DAILY 200 tablet 2   MOUNJARO 15 MG/0.5ML Pen Inject 15 mg into the skin once a week.     nitroGLYCERIN  (NITROSTAT ) 0.4 MG SL tablet PLACE 1 TABLET UNDER THE TONGUE EVERY FIVE MINUTES AS NEEDED FOR CHEST PAIN. 25 tablet 6   nystatin cream (MYCOSTATIN) Apply 1 Application topically as needed for dry skin.     potassium chloride  (KLOR-CON ) 10 MEQ tablet TAKE 1 TABLET BY MOUTH DAILY 90 tablet 1   tadalafil (CIALIS) 10 MG tablet Take 10 mg by mouth daily as needed for erectile dysfunction.     tamsulosin (FLOMAX)  0.4 MG CAPS capsule Take 0.4 mg by mouth 2 (two) times daily.     torsemide  (DEMADEX ) 20 MG tablet Take 1 tablet (20 mg total) by mouth every other day. (Patient taking differently: Take 20 mg by mouth as needed.) 45 tablet 3   triamcinolone cream (KENALOG) 0.1 % Apply topically as needed.     valACYclovir (VALTREX) 500 MG tablet Take 500 mg by mouth See admin instructions. Take 4 tables by mouth ever 12 hours for 1 day     No current facility-administered medications for this encounter.    ROS- All systems are reviewed and negative except as per the HPI above.  Physical Exam: Vitals:   01/13/24 1050  BP: 136/82  Pulse: 73  Weight: 123.7 kg  Height: 5' 9 (1.753 m)    GEN: Well nourished, well developed in no acute distress CARDIAC: Regular rate and rhythm, no murmurs, rubs, gallops  RESPIRATORY:  Clear to auscultation without rales, wheezing or rhonchi  ABDOMEN: Soft, non-tender, non-distended EXTREMITIES:  No edema; No deformity    Wt Readings from Last 3 Encounters:  01/13/24 123.7 kg  10/08/23 126.6 kg  04/11/23 130.1 kg    EKG Interpretation Date/Time:  Monday January 13 2024 11:07:33 EST Ventricular Rate:  73 PR Interval:  204 QRS Duration:  96 QT Interval:  384 QTC Calculation: 423 R Axis:   95  Text Interpretation: Normal sinus rhythm with 1st degree A-V block Rightward axis Borderline ECG When compared with ECG of 11-Jan-2023 10:55, No significant change was found Confirmed by Kharis Lapenna ([id number]) on 01/13/2024 11:20:24 AM    Echo 01/03/21 demonstrated   1. Left ventricular ejection fraction, by estimation, is 60 to 65%. The  left ventricle has normal function.   2. Right ventricular systolic function is normal. The right ventricular  size is normal.   3. No left atrial/left atrial appendage thrombus was detected.   4. The mitral valve is normal in structure. No evidence of mitral valve regurgitation.   5. The aortic valve is normal in structure. Aortic valve  regurgitation is not visualized. No aortic stenosis is present.   6. Agitated saline contrast bubble study was negative, with no evidence of any interatrial shunt.   Epic records are reviewed at length today   CHA2DS2-VASc Score = 7  The patient's score is based upon: CHF History: 1 HTN History: 1 Diabetes History: 1 Stroke History: 2 Vascular Disease History: 1 Age Score: 1 Gender Score: 0       ASSESSMENT AND PLAN: Paroxysmal Atrial Fibrillation (ICD10:  I48.0) The patient's CHA2DS2-VASc score is 7, indicating a 11.2% annual risk of stroke.   Patient in SR today. ILR shows 0% afib burden.  Continue Eliquis  5 mg BID Continue Lopressor  25 mg BID  Secondary Hypercoagulable State (ICD10:  D68.69) The patient is at significant risk for stroke/thromboembolism based upon his CHA2DS2-VASc Score of 7.  Continue Apixaban  (Eliquis ). No bleeding issues.   Obesity Body mass index is 40.29 kg/m.  Encouraged lifestyle modification He has lost ~50 lbs with Mounjaro   CAD No anginal symptoms Followed by Dr Monetta  HTN Stable on current regimen  Central OSA  Patient not currently using CPAP due to air blowing up into empty eye socket.  We discussed risks associated with untreated OSA. He is not interested in resuming at this time.    Follow up in the AF clinic in one year.    Daril Kicks PA-C Afib Clinic [facility] [address] [place], [place] [address] [phone] 01/13/2024 11:35 AM "

## 2024-01-18 ENCOUNTER — Encounter

## 2024-01-20 ENCOUNTER — Ambulatory Visit: Payer: Medicare (Managed Care) | Attending: Cardiology

## 2024-01-20 DIAGNOSIS — I480 Paroxysmal atrial fibrillation: Secondary | ICD-10-CM | POA: Diagnosis not present

## 2024-01-21 ENCOUNTER — Ambulatory Visit: Payer: Self-pay | Admitting: Cardiology

## 2024-01-21 LAB — CUP PACEART REMOTE DEVICE CHECK
Date Time Interrogation Session: 20260118232513
Implantable Pulse Generator Implant Date: 20230403

## 2024-01-24 NOTE — Progress Notes (Signed)
 Remote Loop Recorder Transmission

## 2024-02-18 ENCOUNTER — Encounter

## 2024-02-20 ENCOUNTER — Ambulatory Visit: Payer: Self-pay

## 2024-03-05 ENCOUNTER — Ambulatory Visit: Admitting: Adult Health

## 2024-03-20 ENCOUNTER — Encounter

## 2024-03-22 ENCOUNTER — Ambulatory Visit: Payer: Self-pay
